# Patient Record
Sex: Male | Born: 1945 | Race: White | Hispanic: No | Smoking: Former smoker
Health system: Southern US, Community
[De-identification: ages and names within clinical notes are randomized; demographics above are authoritative.]

## PROBLEM LIST (undated history)

## (undated) DIAGNOSIS — I10 Essential (primary) hypertension: Secondary | ICD-10-CM

## (undated) DIAGNOSIS — I2510 Atherosclerotic heart disease of native coronary artery without angina pectoris: Secondary | ICD-10-CM

## (undated) DIAGNOSIS — M109 Gout, unspecified: Secondary | ICD-10-CM

## (undated) DIAGNOSIS — K219 Gastro-esophageal reflux disease without esophagitis: Secondary | ICD-10-CM

## (undated) DIAGNOSIS — G8929 Other chronic pain: Secondary | ICD-10-CM

## (undated) DIAGNOSIS — J439 Emphysema, unspecified: Secondary | ICD-10-CM

## (undated) DIAGNOSIS — J9600 Acute respiratory failure, unspecified whether with hypoxia or hypercapnia: Secondary | ICD-10-CM

## (undated) DIAGNOSIS — I259 Chronic ischemic heart disease, unspecified: Secondary | ICD-10-CM

## (undated) DIAGNOSIS — J189 Pneumonia, unspecified organism: Secondary | ICD-10-CM

## (undated) DIAGNOSIS — R0609 Other forms of dyspnea: Secondary | ICD-10-CM

## (undated) DIAGNOSIS — M545 Low back pain, unspecified: Secondary | ICD-10-CM

## (undated) DIAGNOSIS — R0600 Dyspnea, unspecified: Secondary | ICD-10-CM

## (undated) DIAGNOSIS — E7800 Pure hypercholesterolemia, unspecified: Secondary | ICD-10-CM

## (undated) DIAGNOSIS — E785 Hyperlipidemia, unspecified: Secondary | ICD-10-CM

## (undated) HISTORY — PX: CORONARY ANGIOPLASTY WITH STENT PLACEMENT: SHX49

---

## 1998-02-07 ENCOUNTER — Other Ambulatory Visit: Admission: RE | Admit: 1998-02-07 | Discharge: 1998-02-07 | Payer: Self-pay | Admitting: Family Medicine

## 1998-11-16 ENCOUNTER — Other Ambulatory Visit: Admission: RE | Admit: 1998-11-16 | Discharge: 1998-11-16 | Payer: Self-pay | Admitting: Gastroenterology

## 1999-06-23 ENCOUNTER — Encounter: Payer: Self-pay | Admitting: Emergency Medicine

## 1999-06-23 ENCOUNTER — Inpatient Hospital Stay (HOSPITAL_COMMUNITY): Admission: EM | Admit: 1999-06-23 | Discharge: 1999-06-23 | Payer: Self-pay | Admitting: Emergency Medicine

## 1999-07-26 ENCOUNTER — Inpatient Hospital Stay (HOSPITAL_COMMUNITY): Admission: EM | Admit: 1999-07-26 | Discharge: 1999-07-29 | Payer: Self-pay | Admitting: Gastroenterology

## 1999-07-26 ENCOUNTER — Encounter: Payer: Self-pay | Admitting: Gastroenterology

## 1999-07-27 ENCOUNTER — Encounter: Payer: Self-pay | Admitting: Gastroenterology

## 1999-08-31 ENCOUNTER — Encounter (INDEPENDENT_AMBULATORY_CARE_PROVIDER_SITE_OTHER): Payer: Self-pay | Admitting: Specialist

## 1999-08-31 ENCOUNTER — Observation Stay (HOSPITAL_COMMUNITY): Admission: RE | Admit: 1999-08-31 | Discharge: 1999-09-01 | Payer: Self-pay | Admitting: Surgery

## 2000-05-17 ENCOUNTER — Ambulatory Visit (HOSPITAL_COMMUNITY): Admission: RE | Admit: 2000-05-17 | Discharge: 2000-05-17 | Payer: Self-pay | Admitting: Internal Medicine

## 2000-05-17 ENCOUNTER — Encounter: Payer: Self-pay | Admitting: Internal Medicine

## 2001-02-28 ENCOUNTER — Encounter (INDEPENDENT_AMBULATORY_CARE_PROVIDER_SITE_OTHER): Payer: Self-pay | Admitting: Specialist

## 2001-02-28 ENCOUNTER — Other Ambulatory Visit: Admission: RE | Admit: 2001-02-28 | Discharge: 2001-02-28 | Payer: Self-pay | Admitting: Gastroenterology

## 2004-11-25 ENCOUNTER — Inpatient Hospital Stay (HOSPITAL_COMMUNITY): Admission: EM | Admit: 2004-11-25 | Discharge: 2004-12-01 | Payer: Self-pay | Admitting: Emergency Medicine

## 2004-11-25 ENCOUNTER — Ambulatory Visit: Payer: Self-pay | Admitting: Internal Medicine

## 2004-11-28 ENCOUNTER — Ambulatory Visit: Payer: Self-pay | Admitting: Pulmonary Disease

## 2006-10-21 ENCOUNTER — Ambulatory Visit: Payer: Self-pay | Admitting: Cardiology

## 2006-10-21 ENCOUNTER — Inpatient Hospital Stay (HOSPITAL_COMMUNITY): Admission: EM | Admit: 2006-10-21 | Discharge: 2006-10-23 | Payer: Self-pay | Admitting: Cardiology

## 2006-10-22 ENCOUNTER — Encounter: Payer: Self-pay | Admitting: Cardiology

## 2006-11-13 ENCOUNTER — Ambulatory Visit: Payer: Self-pay | Admitting: Cardiovascular Disease

## 2007-01-28 ENCOUNTER — Ambulatory Visit: Payer: Self-pay | Admitting: Cardiology

## 2007-02-03 ENCOUNTER — Ambulatory Visit: Payer: Self-pay | Admitting: Cardiovascular Disease

## 2007-04-16 ENCOUNTER — Ambulatory Visit: Payer: Self-pay | Admitting: Cardiology

## 2007-07-21 ENCOUNTER — Ambulatory Visit: Payer: Self-pay | Admitting: Cardiovascular Disease

## 2007-07-21 ENCOUNTER — Ambulatory Visit: Payer: Self-pay | Admitting: Cardiology

## 2007-08-14 ENCOUNTER — Ambulatory Visit: Payer: Self-pay | Admitting: Cardiovascular Disease

## 2008-02-02 ENCOUNTER — Ambulatory Visit: Payer: Self-pay | Admitting: Cardiovascular Disease

## 2008-02-05 ENCOUNTER — Ambulatory Visit: Payer: Self-pay

## 2009-03-31 ENCOUNTER — Encounter (INDEPENDENT_AMBULATORY_CARE_PROVIDER_SITE_OTHER): Payer: Self-pay | Admitting: *Deleted

## 2010-11-28 NOTE — Assessment & Plan Note (Signed)
[facility]                            CARDIOLOGY OFFICE NOTE   NASIIR, MONTS                     MRN:          [id number]  DATE:02/03/2007                            DOB:          24-Aug-1945    Tony Garcia presents as an outpatient to the [facility] Cardiology  office on February 03, 2007.  He is a 65 year old gentleman who suffered an  anterior myocardial infarction back in April 2008 and was treated with  overlapping bare-metal stents in his LAD for a total occlusion.  His  post MI LVEF is well preserved at 55%.  From a symptomatic standpoint,  he has done very well.   His activity level has been fair.  He has been active and walks  regularly but has really not been engaged in any formal exercise.  He  has been trying to watch his diet but says that he likes country  cooking.  He has not had any chest pain, dyspnea, orthopnea, PND,  edema, or palpitations.  He had an episode of lightheadedness last week  upon standing but he was out in 100-degree heat when this occurred.  He  has had no syncope or other complaints.   CURRENT MEDICATIONS:  1. PLATO study drug.  2. Pravachol 40 mg at bedtime.  3. Aspirin 81 mg daily.  4. Nexium 40 mg daily.  5. Coreg 6.25 mg twice daily.  6. Lisinopril 10 mg daily.   ALLERGIES:  PENICILLIN.   PHYSICAL EXAMINATION:  The patient is alert and oriented.  He is no  acute distress.  Weight is 193 pounds, blood pressure is 124/62, heart  rate is 57, respiratory rate is 16.  HEENT:  Normal.  NECK:  Normal carotid upstrokes without bruits, jugular venous pressure  is normal.  LUNGS:  Clear to auscultation bilaterally.  HEART:  Regular rate and rhythm without murmurs or gallops.  ABDOMEN:  Soft, obese, nontender, no organomegaly.  EXTREMITIES:  No clubbing, cyanosis, or edema.  Peripheral 2+ and equal  throughout.   Lab work reviewed from April showed a total cholesterol of 213 with an  HDL of 29 and an LDL  of 146.  Renal function and CBC were within normal  limits.   ASSESSMENT:  Tony Garcia remains stable from a cardiovascular  standpoint.  His cardiac problems are as follows:  1. History of anterior wall myocardial infarction and percutaneous      coronary intervention of the left anterior descending artery.  His      blood pressure and heart rate are under optimal control on medical      therapy with carvedilol and lisinopril.  Did not make any      adjustments today.  He should continue on aspirin and PLATO study      drug.  As above, LVEF is excellent at 55%.  2. Dyslipidemia.  His lipids were drawn at the time of his myocardial      infarction.  He was not on therapy at that time.  He has been      intolerant to multiple statins and he is  now tolerating Pravachol      well.  He is due for lipids but he has already eaten this morning      and we asked him to return to the clinic for a lipid panel and      liver function tests.  Also encouraged increased exercise with an      aim towards weight loss.  Tony Garcia has lost 5 pounds since his      last office visit and he is down from a 38 pants to a 36-inch      pants.  3. Followup.  I would like to see Tony Garcia back in 6 months.  If      he has any problems in the interim, I would be happy to see him      back sooner.     [name], MD  Electronically Signed    MDC/MedQ  DD: 02/03/2007  DT: 02/03/2007  Job #: [id number]   cc:   Gaspar Garbe, M.D.

## 2010-11-28 NOTE — Assessment & Plan Note (Signed)
[facility]                            CARDIOLOGY OFFICE NOTE   CARNIE, BRUEMMER                     MRN:          [id number]  DATE:07/21/2007                            DOB:          [birth year]    Tony Garcia was seen in follow-up at the [facility] Cardiology office  on July 21, 2007.  Tony Garcia is a 65 year old gentleman who had an  anterior wall MI in April 2008.  He was treated with overlapping bare  metal stents in the LAD.  He had excellent recovery of his LV function  with an ejection fraction of 55%.   Tony Garcia has minimal cardiac symptoms.  He has noted mild exertional  dyspnea which he relates to deconditioning.  He has had no chest pain or  pressure.  He denies lightheadedness, palpitations or near syncope.  He  has had no lower extremity edema.   The main problem has been related to statin intolerance.  HE HAS BEEN  INTOLERANT TO MULTIPLE STATINS IN THE PAST.  Following his MI he was  ultimately started on Tricor as well in order to better treat his low  HDL cholesterol and hypertriglyceridemia.  He developed a good deal of  difficulty with bilateral knee pain as well as knee swelling.  He  stopped taking the Tricor back in the fall and stopped Pravachol in late  November.  His symptoms improved, but he does have residual knee pain.  He gave himself another trial of Pravachol at the end of December and  took it for only 3 days before developing recurrent knee swelling.  He  has been off of all lipid lowering therapy with the exception of fish  oil and his symptoms are slowly improving.  Despite that, he continues  to have some leg pain with walking and involves the knees as well as the  calves.   CURRENT MEDICATIONS:  1. Aspirin 81 mg daily.  2. PLATO study drug, which is completed today.  3. Nexium 40 mg daily.  4. Coreg 6.25 mg twice daily.  5. Lisinopril 10 mg daily.   ALLERGIES:  PENICILLIN.   PHYSICAL  EXAMINATION:  The patient is alert and oriented.  He is in no  acute distress.  His weight is 201, blood pressure 126/82, heart rate  52, respiratory rate 16.  HEENT is normal.  NECK:  Normal carotid upstrokes without bruits.  Jugular venous pressure  is normal.  There is no thyromegaly or thyroid nodules.  LUNGS:  Clear to auscultation bilaterally.  Heart regular rate and rhythm without murmurs or gallops.  ABDOMEN:  Soft, obese, nontender no organomegaly.  Bowel sounds are  present.  EXTREMITIES:  There is no clubbing, cyanosis or edema.  Pedal pulses are  2+ and equal.  Skin is warm and dry without rash.   ASSESSMENT:  1. Coronary artery disease status post anterior myocardial infarction.      Will continue current medical therapy which includes Coreg,      lisinopril and aspirin.  [name] was previously and the PLATO      study  which is a randomized trial comparing Plavix to another      antiplatelet drug.  Now that he has completed the study, I would      like to change him over to open label Plavix to take him out to 1      year out from his infarct.  This would continue through April of      this year.  At that point, he can discontinue Plavix and remain on      aspirin alone.  His blood pressure is in the ideal range and he      does not require upward titration of his Coreg or lisinopril in the      setting of normalized left ventricular function.  2. Dyslipidemia.  Lipids from August of last year showed total      cholesterol of 203 with triglycerides of 249, HDL of 29 and an LDL      of 124.  At that point he was started on Tricor.  As detailed      above, he is off of all lipid lowering therapy.  In the setting of      his myocardial infarction and known coronary artery disease      certainly needs lipid lowering therapy.  He has given a good trial      to multiple medications and I am comfortable saying that he is      COMPLETELY INTOLERANT TO ALL STATINS AT THIS  POINT.  I am going to      give him a trial of Niaspan 500 mg daily for 2 weeks and to      increase to 1 gram daily.  I have also asked him to resume fish      oil.  I counseled him in detail about flushing and about potential      of flushing and gave him instructions to take his Niaspan 30      minutes after aspirin.  Will followup lipids and LFTs in 2 months      or  if he prefers he could follow in Dr. Deneen Harts office.   Overall, I think Tony Garcia is doing well.  We talked about the  importance of weight loss as he has significant central obesity.  He  will hopefully as his knees improve he will be able to do more exercise.  I would like to see him back in follow-up in 6 months.  I have scheduled  him for an ABI test to rule out vascular disease as  contributing factor  to his leg pain.     [name], MD  Electronically Signed    MDC/MedQ  DD: 07/21/2007  DT: 07/21/2007  Job #: [id number]   cc:   Gaspar Garbe, M.D.

## 2010-11-28 NOTE — Assessment & Plan Note (Signed)
[facility]                            CARDIOLOGY OFFICE NOTE   Tony Garcia                     MRN:          [id number]  DATE:02/02/2008                            DOB:          [birth year]    Tony Garcia was seen in followup with [facility] office on  February 02, 2008.  He is a 65 year old gentleman with coronary artery  disease who initially presented with an anterior wall MI in April 2008.  He was treated acutely with 2 bare-metal stents in the LAD and had  excellent recovery of his LV function with an post MI, LVEF of 55%.   Tony Garcia complains of exertional dyspnea.  He attributes this to  weight gain, but he is not sure whether this is the cause.  He denies  chest pain.  He also complains of generalized fatigue.  He denies  palpitations, lightheadedness, syncope, or edema.  His main problem at  present is bilateral knee pain.  He has been treated with ibuprofen and  he is going to undergo x-rays in the near future. His activity level is  limited by his knees.  His knee pain was worse with the trial of  multiple statins and even with TriCor.  He is off all lipid-lowering  medications because of exacerbation of his pain problems.   MEDICATIONS:  1. Aspirin 81 mg daily.  2. Nexium 40 mg daily.  3. Coreg 6.25 mg b.i.d.  4. Lisinopril 10 mg daily.  5. Fish oil 1200 mg 2 daily.  6. Vitamin D 1000 mg daily.  7. Multivitamin daily.   ALLERGIES:  PENICILLIN.   PHYSICAL EXAMINATION:  GENERAL:  The patient is alert and oriented.  He  is in no acute distress.  VITAL SIGNS:  Weight is 210 pounds, blood pressure 154/80, heart rate  55, and respiratory rate 16.  HEENT:  Normal.  NECK:  Normal carotid upstrokes with soft bilateral bruits.  Jugular  venous pressure is normal.  LUNGS:  Clear bilaterally.  HEART:  Bradycardic and regular with a grade 1/6 systolic ejection  murmur at the base.  No gallops.  No diastolic murmurs.  ABDOMEN:  Soft, obese, and nontender.  No organomegaly.  EXTREMITIES:  No clubbing, cyanosis, or edema.  Peripheral pulses are  intact and equal.   EKG shows sinus bradycardia with an age-indeterminate septal infarct.   ASSESSMENT:  1. Coronary artery disease, status post anterior myocardia infarction.      Continue medical therapy with aspirin, Coreg, and lisinopril.  His      blood pressure is elevated and he will benefit from more aggressive      antihypertensive treatment.  I doubled his lisinopril to 20 mg.  We      will see how he initially responses to this.  Another consideration      would be to increase his Coreg and this can be done at a later      date.  It may be limited in Coreg increased by his resting      bradycardia.  He clearly would benefit from weight loss,  which      would vastly improve his blood pressure control, lipid control, and      even his knee pain.  We had an at length discussion regarding this      matter today.  2. Dyslipidemia, intolerant to LIPID-LOWERING MEDICATIONS.  Continue      fish oil.  3. Exertional dyspnea.  In the setting of known coronary artery      disease and previous myocardial infarction, we will check an      adenosine Myoview to rule out recurrent ischemia.  Of note, the      patient has 2 bare metal stents in his left anterior descending.  I      am hopeful this is not due to an intrinsic cardiac problem and may      be related to his obesity.   For followup, I will see Tony Garcia back in 6 months.     Tony Garcia. Tony Seltzer, MD  Electronically Signed    MDC/MedQ  DD: 02/02/2008  DT: 02/03/2008  Job #: [id number]   cc:   Burnell Blanks, MD

## 2010-12-01 NOTE — H&P (Signed)
NAME:  Tony Garcia, Tony Garcia              ACCOUNT NO.:  [id number]   MEDICAL RECORD NO.:  [id number]          PATIENT TYPE:  EMS   LOCATION:  [facility]                         FACILITY:  [facility]   PHYSICIAN:  Larina Earthly, M.D.        DATE OF BIRTH:  Dec 01, 1945   DATE OF ADMISSION:  11/25/2004  DATE OF DISCHARGE:                                HISTORY & PHYSICAL   CHIEF COMPLAINT:  Fevers, chills, myalgias refractory to Tylenol, and  nonsteroidal antiinflammatory agents while on amoxicillin for the last five  days.   HISTORY OF PRESENT ILLNESS:  This is a 65 year old Caucasian male who has a  past medical history significant for gastroesophageal reflux disease  associated with Barrett esophagus and hypertension, who started experiencing  fevers and chills along with myalgias last Tuesday, approximately five days  ago, associated with some transient chest tightness.  He actually left work  and has not been at work since secondary to the same.  He was started on  amoxicillin empirically, on Nov 22, 2004, but despite using Tylenol  alternating with over the counter ibuprofen, the patient continued to  experience temperatures ranging from 99 degrees to 104 degrees Fahrenheit  associated with again fever, rigors, and diffuse myalgias.  The patient  subsequently presented to the emergency room for further evaluation today  and was found to have a right upper lobe pneumonia.   REVIEW OF SYSTEMS:  Negative for nausea, vomiting but positive for diffuse  headaches along with the myalgias.  Negative for chest pain.  Positive for  myalgias.  Negative for change in bowel habits or new neurological deficits.  Interestingly, the patient states that he has had minimal cough or pulmonary  symptoms.   SOCIAL HISTORY:  The patient is married for numerous years.  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  Denies any tobacco abuse  but did smoke remotely.  States that he consumes 3-4 beers per day  but has  not consumed any in approximately a week.  The patient denies any history of  aspiration pneumonia.   CURRENT MEDICATIONS:  1.  Norvasc 5 mg p.o. every day.  2.  Nexium 40 mg p.o. every day.   PROBLEM LIST:  1.  Hypertension.  2.  Gastroesophageal reflux disease with Barrett esophagus, followed by Dr.      Russella Dar with endoscopy planned every two years with the last endoscopy      being in 2005.  3.  Hyperlipidemia with a history of Lipitor causing myalgias.  4.  History of cholecystectomy in 1999.  5.  History of remote appendectomy.   FAMILY HISTORY:  Significant for pneumonia in his mother and his father who  died at the age of 31 of coronary artery disease and strokes.  There is no  malignancy in the family.   PHYSICAL EXAMINATION:  GENERAL:  We have a well nourished Caucasian male  lying in bed in no apparent distress, answering questions in full sentences.  Alert and oriented x 3.  VITAL SIGNS:  Temperature 103.3 degrees Fahrenheit, decreased to 102.6  degrees during  ER stay.  Blood pressure 126/72, pulse 75 and regular,  respirations 20 non-labored.  Oxygen saturation 95% on room air.  HEENT:  Sclerae anicteric.  Extraocular movements are intact.  There are no  oropharyngeal lesions.  NECK:  Supple.  There is no cervical or axillary lymphadenopathy.  Face is  symmetric.  LUNGS:  Clear to auscultation bilaterally.  CARDIOVASCULAR:  Reveals a regular rate and rhythm.  ABDOMEN:  Reveals a soft, nontender, nondistended abdomen.  Bowel sounds are  present.  EXTREMITIES:  Reveals no edema.  Pedal pulses are intact.  There is no  active synovitis.  NEUROLOGIC:  Grossly nonfocal.   ASSESSMENT/PLAN:  1.  Pneumonia, presumably community-acquired complicated by moderate alcohol      use.  We will continue Rocephin and azithromycin initiated during      emergency room stay and follow up on blood cultures x 2.  Oxygen      saturation is currently within normal limits on  room air and clinically      the patient's appearance is not severe with the exception of his fevers,      chills, and myalgias.  Again the patient has a minimal cough and this is      certainly concerning, again possible etiologies include aspiration with      his alcohol use, although he denies any significant intake and/or      altered mental status using the same.  Certainly underlying malignancy      is of issue, however, the patient states that his minimal tobacco abuse      was remote.  We will certainly need to follow up on chest x-ray to      ensure clearance of his right upper lobe pneumonia.  2.  Barrett esophagus with gastroesophageal reflux disease.  We will      continue proton pump inhibitor and Mylanta p.r.n.  The patient's last      EGD was last year per family report.  3.  Hypertension.  We will continue calcium channel blocker.  4.  The patient is fairly ambulatory, however, given significant rigors and      chills, we will start deep vein thrombosis prophylaxis with subcutaneous      Lovenox.      RA/MEDQ  D:  11/25/2004  T:  11/26/2004  Job:  [id number]   cc:   Gaspar Garbe, M.D.  [address]  [place]  [place] [address]  Fax: [phone]   Venita Lick. Russella Dar, M.D. [facility]

## 2010-12-01 NOTE — Discharge Summary (Signed)
NAMEKNOLAN, SIMIEN              ACCOUNT NO.:  [id number]   MEDICAL RECORD NO.:  [id number]          PATIENT TYPE:  INP   LOCATION:  2017                         FACILITY:  [facility]   PHYSICIAN:  [name], MD  DATE OF BIRTH:  05-Nov-1945   DATE OF ADMISSION:  10/21/2006  DATE OF DISCHARGE:  10/23/2006                               DISCHARGE SUMMARY   PRIMARY CARDIOLOGIST:  Tonny Bollman, M.D.   PRIMARY CARE PHYSICIAN:  Guerry Bruin, M.D.   PRINCIPAL DIAGNOSIS:  Acute anterior ST-segment elevation myocardial  infarction.   SECONDARY DIAGNOSES:  1. Coronary artery disease.  2. Hypertension.  3. Hyperlipidemia.  4. Remote tobacco abuse.  5. Gastroesophageal reflux disease.  6. Barrett's esophagus status post cholecystectomy approximately 7-8      years ago.  7. EtOH abuse.  8. Obesity.   ALLERGIES:  LIPITOR AND ZOCOR INTOLERANCE with significant myalgias.   PROCEDURES:  Left heart cardiac catheterization with successful PCI and  stenting of the occluded left anterior descending with placement of two  Multi-link Vision bare-metal stents.  A 2D echocardiogram revealing an  EF of 55% with periapical hypokinesis.   HISTORY OF PRESENT ILLNESS:  A 65 year old married Caucasian male with  no prior history of CAD.  He was in his usual state of health to  approximately 2-3 weeks prior to admission when he began to experience  intermittent resting and exertional substernal chest pressure lasting  just a few minutes and resolving with belching.  On the day of  admission, October 21, 2006, he was at home and developed 10/10 substernal  chest pressure with shortness of breath at approximately 2:30 p.m.  When  symptoms did not resolve, he activated EMS and initial ECG showed  anterior ST-segment elevation.  He was treated with sublingual  nitroglycerin as well as aspirin and morphine with some reduction in  chest pain to 3/10.  He was taken emergently to the [facility] Cardiac  Cath Lab.   HOSPITAL COURSE:  Emergent cardiac catheterization was performed  revealing a totally occluded proximal LAD and otherwise nonobstructive  coronary artery disease.  He underwent successful PCI and stenting of  the proximal LAD with placement of a 3.0 x 28-mm Multi-link Vision bare-  metal stent as well as a 3.0 x 8-mm Multi-link Vision bare-metal stent.  He tolerated this procedure well and post procedure was monitored in the  coronary intensive care unit, where he was placed on beta blocker, ACE  inhibitor, aspirin, and Statin therapy.  Prior to catheterization, the  patient was involved in the PLATO study and was maintained on the PLATO  study drug regimen throughout his hospitalization with good tolerance.  A 2D echocardiogram was performed on April 8, revealing an EF of 55%  with periapical hypokinesis.  He has been placed on Pravachol therapy  secondary to LIPITOR and ZOCOR intolerance, which up to this point he  has tolerated.  His medications have been titrated and consolidated, and  he is being discharged home today in good condition.  He has been  counseled on the importance of alcohol cessation.  DISCHARGE LABS:  Hemoglobin 13.2, hematocrit 38.7, WBC 9.8, platelets  325.  MCV 89.0.  Sodium 140, potassium 3.8, chloride 100, CO2 of 30, BUN  11, creatinine 0.99, glucose 100.  PT 13.6, INR 1.0, PTT 28.  Total  bilirubin 0.8, alkaline phosphatase 97.  AST 251, ALT 102, albumin 3.1.  CK 1806, peak MB 149.6, peak troponin-I 97.44.  Total cholesterol 213,  triglycerides 198, HDL 29 and LDL 146.  Calcium of 8.9, magnesium 2.5,  TSH 4.332.   DISPOSITION:  The patient is being discharged home today in good  condition.   FOLLOWUP PLANS AND APPOINTMENTS:  The patient will follow up with Dr.  Tonny Bollman on April 30 at 3:15 p.m.  He is asked to follow up with  his primary care physician, Dr. Wylene Simmer, as previously scheduled.   DISCHARGE MEDICATIONS:  1. Coreg 6.25 mg  b.i.d.  2. Lisinopril 10 mg daily.  3. Aspirin 325 mg daily.  4. PLATO study drug b.i.d.  5. Pravachol 40 mg q.p.m.  6. Nexium 40 mg daily.  7. Nitroglycerin 0.4 mg sublingual p.r.n. chest pain.   OUTSTANDING LABORATORY STUDIES:  None.   DURATION OF DISCHARGE ENCOUNTER:  40 minutes including physician time.      Nicolasa Ducking, ANP      [name], MD  Electronically Signed    CB/MEDQ  D:  10/23/2006  T:  10/23/2006  Job:  [id number]   cc:   Gaspar Garbe, M.D.

## 2010-12-01 NOTE — Discharge Summary (Signed)
Tony Garcia, Tony Garcia              ACCOUNT NO.:  [id number]   MEDICAL RECORD NO.:  [id number]          PATIENT TYPE:  INP   LOCATION:  5507                         FACILITY:  [facility]   PHYSICIAN:  Gaspar Garbe, M.D.DATE OF BIRTH:  15-Jul-1946   DATE OF ADMISSION:  11/25/2004  DATE OF DISCHARGE:  12/01/2004                                 DISCHARGE SUMMARY   DIAGNOSES:  1.  Right upper lobe pneumonia, community-acquired.  2.  Elevated liver function tests, presumed secondary to alcohol intake.  3.  Hypertension.  4.  History of Barrett's esophagus and reflux.   DISCHARGE MEDICATIONS:  1.  Avelox 400 mg one p.o. daily to be completed on Dec 09, 2004.  2.  Norvasc 5 mg once daily.  3.  Nexium 40 mg once daily.   LABORATORY DATA:  On date of discharge white count 11.2, hemoglobin 12.9,  hematocrit 39.0, platelet count 557,000.  Sodium 141, potassium 3.9,  alkaline phosphatase slightly elevated at 189, AST slightly elevated at 62.  ALT elevated at 107, total protein 6.8, albumin 2.3.  Hepatitis panel shows  hepatitis A IgG antibody positive indicating prior exposure.  HIV test  negative.  Hepatitis C negative, hepatitis B negative.  Strep pneumonia  screen by urine antigen negative.  Pneumocystis carinii pneumonia negative  for Legionella.  EIA negative.  AFB's X3 smear negative.  Cultures currently  pending.  PPD placed on right arm read as negative on Nov 30, 2004.   PHYSICAL EXAMINATION:  VITAL SIGNS:  On date of discharge vital signs with  temperature 98.2, pulse 58, respiratory rate 20, blood pressure 127/67.  GENERAL APPEARANCE:  No acute distress.  HEENT:  Normocephalic, atraumatic.  Pupils equal, round, reactive to light  and accommodation. Extraocular movements intact. ENT within normal limits.  NECK:  Supple, no lymphadenopathy, jugular venous distention or bruit.  LUNGS:  Patient has coarse right sided sounds consistent with right upper  lobe pneumonia.  HEART:   Regular rate and rhythm.  ABDOMEN:  Soft, non-tender, normoactive bowel sounds.  EXTREMITIES:  No cyanosis, clubbing or edema.  NEUROLOGICAL:  Oriented X3.  Cranial nerves II-XII are intact.  Patient has  5/5 strength bilaterally.   HOSPITAL COURSE:  Patient was admitted after not wanting to come into the  office and having antibiotics called in, he continued with fevers, and was  found to have a right upper lobe pneumonia and was given Rocephin and  azithromycin and watched over the weekend, however on me seeing him on  Monday he continued to have fevers and ID and pulmonary consultations were  undertaken shortly thereafter.  He was put on respiratory precautions,  isolation, and ruled out for tuberculosis with the above laboratory tests.  We continued him on Rocephin and azithromycin until Nov 30, 2004 at which  time he was changed to Avelox by mouth.  The patient's fevers defervesced  buy Nov 29, 2004 and he remained with very low grade fever only sporadically  prior to the time of his discharge.  He underwent a CT scan which did not  show any gross obstruction per  Dr. Lynelle Doctor reading and did not require  bronchoscopy.  Bronchoscopy is being deferred as he has shown clinical  resolution.  He is to have a follow up chest x-ray in three weeks, which if  not fully cleared may require additional work up and bronchoscopy.  The  patient is clear on the above diagnosis and I indicated to him that he  needed to take it easy over the next week until his pneumonia completely  cleared and he should not return to work until Dec 10, 2004 at the earliest.  A note was given to this effect as was a prescription for his Avelox.  We  also discussed that the calcifications on his CT scan were most likely  related to old granulomatous disease and were not consistent with lymph  nodes or cancer nor is there any sign of any tumor or mass causing the  obstruction and the pneumonia.   CONDITION ON  DISCHARGE:  Patient is discharged in good condition on the  morning of Dec 01, 2004.   FOLLOW UP:  Follow up is to be arranged per Dr. Deneen Harts office.  We will  contact him on Monday to arrange follow up in approximately three weeks for  repeat chest x-ray with the above-noted need for bronchoscopy if he does not  completely clear.  Patient has been informed that if his fevers worsen over  the course of the weekend he is to call the office to discuss with the on-  call physician.      RWT/MEDQ  D:  12/01/2004  T:  12/01/2004  Job:  [id number]   cc:   Cliffton Asters, M.D.  [address] [place]  [place] [address]  Fax: [phone]   Shan Levans, M.D. [facility]

## 2010-12-01 NOTE — Assessment & Plan Note (Signed)
[facility]                            CARDIOLOGY OFFICE NOTE   Tony Garcia, Tony Garcia                     MRN:          [id number]  DATE:11/13/2006                            DOB:          Oct 15, 1945    Tony Garcia returns for hospital followup as an outpatient to the  [facility] office on November 13, 2006.  He is a very nice 65-year-  old gentleman who presented to the hospital on April 7th with an acute  anterior MI.  He was brought emergently to the cardiac catheterization  lab and was found to have an occluded LAD.  He was treated with  overlapping bare metal stents with a 3 x 28 mm Vision stent and a second  3 x 8 mm Vision stent just distal to that in an overlapping fashion.  He  had an excellent recovery and was discharged from the hospital on April  9th.  His post MI echocardiogram demonstrated a left ventricular  ejection fraction estimated at 55%.  Since return home he has done  relatively well.  He has been active with daily walking as well as  regular gardening.  He has complained of some joint aches over the last  few days, but this is not uncommon for him as he has significant  arthritis.  He has no chest pain, dyspnea or other complaints.   CURRENT MEDICINES:  1. Clopidogrel 75 mg daily.  2. PLATO study drug daily.  3. Pravachol 40 mg daily.  4. Aspirin 81 mg daily.  5. Nexium 40 mg daily.  6. Lisinopril 10 mg daily.  7. Coreg 6.25 mg twice daily.  8. Fish Oil daily.   ALLERGIES:  NKDA.   PERTINENT PAST MEDICAL HISTORY:  1. A history of hypertension.  2. Remote tobacco abuse.  3. Barrett's esophagus.  4. Longstanding alcohol use.   PHYSICAL EXAMINATION:  He is alert and oriented, in no acute distress.  His weight is 198 pounds, blood pressure is 136/73, heart rate 60,  respiratory rate 16.  HEENT:  Normal.  NECK:  Normal carotid upstrokes with a soft left carotid bruit.  LUNGS:  Clear to auscultation  bilaterally.  HEART:  Regular rate and rhythm with a 2/6 ejection murmur along the  left sternal border.  ABDOMEN:  Soft, nontender, obese, no organomegaly.  EXTREMITIES:  No cyanosis, clubbing or edema.  Peripheral pulses are 2+  and equal throughout.   EKG demonstrates normal sinus rhythm with an anteroseptal MI pattern,  age undetermined, as well as a diffuse anterolateral T-wave abnormality  consistent with his recent myocardial infarction.   Lipid studies from the hospital on April 8th show a cholesterol of 213,  triglycerides of 190, HDL of 29, LDL of 146.   ASSESSMENT:  Tony Garcia is currently stable from a cardiac standpoint  with regard to his recent anterior wall myocardial infarction.  His  current cardiac problems are as follows:  1. Anterior myocardial infarction status post percutaneous coronary      intervention.  He should continue on aspirin and antiplatelet      therapy per  the PLATO study for a minimum of 12 months.  He should      also continue on lisinopril and carvedilol.  I did not make any      dose adjustments today.  I would like to see Tony Garcia back in      approximately 8 weeks and reassess him at that point and likely      will increase his therapy at that time.  2. Dyslipidemia.  I hope that he is not having myalgias from Pravachol      as he has noticed increased aches and pains over the last few days.      He has had problems with both Lipitor and Zocor in the past.  Since      he has some problems with arthritis, I would like him to try to      stick this out for a little while as he certainly has a lot of      benefit to gain from long term Statin use.  I am going to recheck      his lipids and LFTs in 8 weeks and likely add Zetia to his medical      regimen at that point, as I doubt his LDL will be near goal on      monotherapy with Pravachol.  3. Left ventricular dysfunction secondary to his acute myocardial      infarction.  His overall  left ventricular ejection fraction was      preserved at 55%.  He had an apical wall motion abnormality      consistent with his anterior infarction.  He will remain on medical      therapy as described above.   He inquired today about returning to work.  He drives a truck on the  weekends.  I would like him to take 2 more weeks off prior to starting  back to work and I reviewed this today with both Tony Garcia and his  wife.     Tony Fells. Excell Seltzer, MD  Electronically Signed    MDC/MedQ  DD: 11/13/2006  DT: 11/13/2006  Job #: [id number]   cc:   Gaspar Garbe, M.D.

## 2010-12-01 NOTE — H&P (Signed)
Tony Garcia, VONADA              ACCOUNT NO.:  [id number]   MEDICAL RECORD NO.:  [id number]          PATIENT TYPE:  INP   LOCATION:  2889                         FACILITY:  [facility]   PHYSICIAN:  [name], MD  DATE OF BIRTH:  [birth year]   DATE OF ADMISSION:  10/21/2006  DATE OF DISCHARGE:                              HISTORY & PHYSICAL   PRIMARY CARDIOLOGIST:  He is new to [facility]; being seen by  Dr. Tonny Bollman.   PRIMARY CARE PHYSICIAN:  Gaspar Garbe, M.D.   PATIENT PROFILE:  A 65 year old married Caucasian male with no prior  history of CAD who presents with acute anterior ST-segment elevation  myocardial infarction.   PROBLEMS:  1. Acute anterior ST-segment elevation myocardial infarction/chest      pain.  2. Hypertension.  3. Remote tobacco abuse.      a.     90-pack-year history, quitting approximately 10 years ago.  4. ETOH abuse.      a.     He drinks a six-pack per day for the past 35 years.  5. Obesity.  6. GERD/Barrett's esophagus.  7. Status post cholecystectomy approximately 7 to 8 years ago.   HISTORY OF PRESENT ILLNESS:  A 65 year old married Caucasian male with  no prior history of CAD.  He was in his usual state of health until  approximately 2 to 3 weeks ago when he began to experience intermittent  rest and exertional substernal chest pressure lasting a few minutes and  resolving with belching.  This occurred maybe on 2 or 3 occasions over  the past couple of weeks.  Today he was at home and developed a sudden  onset of 10//10 substernal chest pressure with shortness of breath at  approximately 2:30 p.m.  He denies any nausea, vomiting, or diaphoresis.  When the symptoms did not resolve, he activated EMS, and upon their  arrival, ECG showed anterior ST-segment elevations.  He was treated with  sublingual nitroglycerin x2 as well as 324 mg of aspirin and IV morphine  which reduced his pain to 3/10.  He was taken emergently to  the [facility] ED where his ECG was rapidly evaluated and he was transferred  immediately to the [facility] cath lab, arriving at 1617.   ALLERGIES:  NO KNOWN DRUG ALLERGIES.   HOME MEDICATIONS:  Norvasc and Nexium.   FAMILY HISTORY:  His mother is age 66 with a history of hypertension.  Father died with a history of hypertension and pneumonia.  He has 1  sister and 1 brother, both are alive and well.   SOCIAL HISTORY:  He lives in [place] with his wife.  He works as a Ecologist.  He has 2 grown children.  He has a 90-pack-year history of  tobacco abuse, quitting approximately 10 years ago.  He drinks a six-  pack per day and has done so for the past 30 to 35 years.  He denies any  drug use.  He does not routinely exercise.   REVIEW OF SYSTEMS:  Positive chest pain, shortness of breath, otherwise  all systems reviewed and negative.   PHYSICAL EXAM:  VITAL SIGNS:  He is afebrile, his heart rate 68,  respirations 14, blood pressure 159/94, pulse ox 92% on 2 L.  GENERAL:  A Caucasian male in no acute distress, awake, alert, oriented  x3.  NECK:  Normal carotid upstroke, no bruit or JVD.  LUNGS:  Respiration is regular, unlabored, clear to auscultation.  CARDIAC:  Regular S1, S2.  No murmurs.  ABDOMEN:  Round, soft, nontender, nondistended.  Bowel sounds present  x4.  EXTREMITIES:  Warm and dry, pink.  No clubbing, cyanosis, or edema.  Dorsalis pedis and posterior tibial pulses 2+ and equal bilaterally.   CLINICAL FINDINGS:  Chest x-ray is pending.  EKG shows sinus rhythm with  anterior ST-segment elevations.  Lab work is pending.   ASSESSMENT AND PLAN:  1. Anterior ST-segment elevation myocardial infarction, emergent      catheterization.  Add aspirin, Plavix, Lipitor 80, beta blocker,      and ACE inhibitor.  Eventual cardiac rehab and ETOH cessation.  He      has been enrolled in the PLATO study; thus, he will receive the      PLATO study drug in lieu of Plavix.  2.  Hypertension, blood pressure currently elevated, add beta blocker.  3. Lipid status currently unknown.  We will check lipids and liver      function tests and add Statin.  4. Gastroesophageal reflux disease.  Add an H2 blocker secondary to      possible interaction between proton pump inhibitor and Plavix.      Nicolasa Ducking, ANP      [name], MD  Electronically Signed    CB/MEDQ  D:  10/21/2006  T:  10/22/2006  Job:  [id number]

## 2010-12-01 NOTE — H&P (Signed)
[place]  Patient:    Tony Garcia, Tony Garcia                       MRN: [id number] Adm. Date:  07/26/99 Attending:  Judie Petit T. Pleas Koch., M.D. [facility] Dictator:   Antionette Fairy. Gill, P.A.C.                         History and Physical  CHIEF COMPLAINT:  Status post ERCP with sphincterotomy.  HISTORY OF PRESENT ILLNESS:  Mr. Kussman is a relatively healthy 65 year old white male with history significant for gastroesophageal reflux disease and Barretts esophagus.  He is followed by Dr. Claudette Head.  He underwent an ERCP today by  Dr. Russella Dar for evaluation of epigastric pain associated with nausea and vomiting and anorexia over the past two months.  Laboratory studies performed on January 5, 001 showed elevated liver function tests that included a total bili of 5.0, alk phos 229, AST 291, ALT 466.  An abdominal ultrasound was performed on July 25, 1999 that showed several tiny sand-like stones and thickened gallbladder wall, consistent with cholecystitis.  At that time, a surgical consult was set up with Dr. Magnus Ivan for consideration of a cholecystectomy.  Please note that he had experienced five to six episodes of this severe epigastric pain.  One episode precipitated a visit to the emergency room at [facility] on June 23, 1999.  At that time, a CBC, chemistry profile, lipase, troponin, CK, and liver function tests were all apparently unremarkable.  ERCP today revealed a normal-appearing cholangiogram and normal-appearing partial pancreatogram.  A sphincterotomy and balloon pull-through were performed. Mr. Schroth tolerated the procedure without difficulty, and is being admitted or observation.  PAST MEDICAL HISTORY:  GERD, Barretts esophagitis (no dysplasia), sleep apnea, bursitis, status post appendectomy at [age] years of age.  MEDICATIONS: 1. Prevacid 30 mg p.o. q.d. 2. Hyoscyamine 0.125 mg two q.4h. p.r.n. pain. 3. Ibuprofen  p.r.n.  ALLERGIES:  No known drug allergies.  FAMILY HISTORY:  Pertinent for heart disease and Parkinsons in his father. There is also a family history of longevity.  He has a sister that had her gallbladder removed last year.  There is no family history of colon/gastric cancer or other  gallbladder diseases.  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 He is married.  He has two daughters, ages 1 and 81.  He also has two grandchildren. [place]  He apparently quit smoking one and one-half years ago, after smoking three packs per day.  His wife states he drinks a couple of beers a week, and that he was formerly a heavy drinker many years ago.  REVIEW OF SYSTEMS:  HEENT - Pertinent for glasses.  CARDIOVASCULAR - No problems noted.  RESPIRATORY - Sleep apnea, otherwise no problems noted. GASTROINTESTINAL - As above, and in addition, he has not experienced any diarrhea, constipation, melena, hematochezia, hematemesis, or dysphagia.  GENITOURINARY - No problems noted.  MUSCULOSKELETAL - Pertinent for right knee swelling and bursitis.  LABORATORY:  Please note these labs were performed on July 21, 1999.  White count 5.9, hemoglobin 15.1, hematocrit 47.2, platelet count 407.  Sodium 136, potassium 4.0, chloride 102, CO2 28, glucose 93, BUN 13, creatinine 1.1, calcium 9.3. Total bili 5.0, alk phos 229, SGOT 291, SGPT 466, total protein 7.7, albumin 4.3, and  amylase 84.  PHYSICAL EXAMINATION:  GENERAL:  Mr. Buysse is arousable, but he is sleepy secondary to his anesthesia. He drifts in and out of sleep, but he is in no acute distress.  His wife is at is bedside.  VITAL SIGNS:  Blood pressure 130/88, temperature 97.3, pulse 72, respirations 16, weight 184 pounds.  HEENT:  Atraumatic, normocephalic.  His pupils are equal and round.  He has icteric sclerae.  Pink conjunctivae.  There is no nasal discharge,  although a nasal cannula is in place.  His mucous membranes are moist.  NECK:  Supple.  HEART:  S1, S2, with regular rate and rhythm.  No murmurs, gallops, or rubs were appreciated.  LUNGS:  Poor inspiratory effort secondary to sleepiness.  Questionable decreased breath sounds in the right lower lobe, otherwise clear.  ABDOMEN:  Bowel sounds were present.  Abdomen was soft, minimally distended. Nontender.  No hepatosplenomegaly appreciated.  RECTAL:  Deferred.  EXTREMITIES:  Without edema.  Pedal pulses 2+.  MUSCULOSKELETAL:  Without lower joint extremity tenderness.  IMPRESSION: 62. A 65 year old white male, status post ERCP with sphincterotomy.  Evaluation as    without complications.  Performed today by Dr. Russella Dar.  There were no biliary    abnormalities noted, but small common bile stones versus ampullary sphincter    dysfunction implied by symptoms and liver function tests. 2. History of GERD and Barretts esophagus. 3. History of sleep apnea. 4. History of bursitis.  PLAN: 1. Will admit for observation. 2. Liver function tests and CBC in the a.m. 3. Analgesics p.r.n. 4. Continue Prevacid. 5. If no complications, likely discharge in the a.m. and have patient keep    appointment with Dr. Magnus Ivan as an outpatient. 6. Further orders - see the chart.DD:  07/26/99 TD:  07/26/99 Job: [id number]

## 2010-12-01 NOTE — H&P (Signed)
[place]  Patient:    Tony Garcia, Tony Garcia                       MRN: [id number] Adm. Date:  07/26/99 Attending:  Judie Petit T. Pleas Koch., M.D. [facility] Dictator:   Antionette Fairy. Gill, P.A.C.                         History and Physical  CHIEF COMPLAINT:  Status post ERCP with sphincterotomy.  HISTORY OF PRESENT ILLNESS:  Tony Garcia is a relatively healthy 65 year old white male with history significant for gastroesophageal reflux disease and Barretts esophagus.  He is followed by Dr. Judie Petit T. Pleas Koch.  He underwent an ERCP today by Dr. Russella Dar for evaluation, after experiencing several episodes of severe epigastric pain (five to six), associated with nausea and anorexia over the past two months.  Lab studies performed on July 21, 1999 DD:  07/26/99 TD:  07/26/99 Job: [id number]

## 2010-12-01 NOTE — Cardiovascular Report (Signed)
Tony Garcia              ACCOUNT NO.:  [id number]   MEDICAL RECORD NO.:  [id number]          PATIENT TYPE:  INP   LOCATION:  2807                         FACILITY:  [facility]   PHYSICIAN:  [name], MD  DATE OF BIRTH:  09/24/1945   DATE OF PROCEDURE:  10/21/2006  DATE OF DISCHARGE:                            CARDIAC CATHETERIZATION   PROCEDURE:  Left heart catheterization, selective coronary angiography,  PTCA and stenting of the LAD and Star close of the right femoral artery.   INDICATION:  Tony Garcia is a 65 year old man who has a history of  hypertension and tobacco abuse who presented with an acute anterior wall  myocardial infarction.  He was brought in directly to the cath lab by  EMS in the setting of typical symptoms and a classic ECG.  The patient  had ongoing chest pain on presentation and was consented emergently for  his catheterization.   Risks and indications of the procedure were explained to the patient.  He was enrolled in the PLATO study.  Informed consent was obtained.  The  right groin was prepped, draped and anesthetized with 1% lidocaine.  Using the modified Seldinger technique, a 6-French sheath was placed in  the right femoral artery and initial images of the right coronary artery  were performed with a 6-French diagnostic JR-4 catheter.  Following  diagnostic angiography, a 6-French XB-LAD guide catheter was inserted,  and pictures of the left coronary artery were taken.  The LAD was  occluded, and I elected to perform intervention.  Angiomax was used for  anticoagulation.  The patient was given study drug via the PLATO study  which randomizes him to either 600 mg of clopidogrel or PLATO study  drug.  I initially attempted to wire the lesion with a cougar guidewire  but was unable to pass that guidewire into the LAD.  I then attempted a  whisper wire and ultimately was able to pass the whisper wire into the  LAD but could not cross the  lesion even with the use of a balloon to  direct the wire directly into the lesion.  At that point, after some  time, I elected to remove the wire and balloon and try a 300 cm PT2  moderate support wire with backup via a 2.0 x 20 mm over the wire  balloon.  I eventually was able to cross the lesion with that wire and  pass into the distal LAD.  The lesion was then dilated with a 2-0 x 20-  mm Maverick balloon, and that balloon was dilated up to 6 and then 10  atmospheres.  Following balloon dilatation, there was TIMI II flow in  the vessel.  There was marked residual stenosis.  I elected to stent the  lesion with a 3-0 x 28 Vision stent which was deployed at 14  atmospheres.  Following stent deployment, there was excellent stent  expansion with TIMI III flow in the vessel.  There was a large diagonal  branch that was subtotally occluded.  I elected to post dilate the  stent, and that was performed with  a 3.25 x 13 mm power sail balloon on  multiple inflations up to 16 atmospheres.  Following postdilatation, the  diagonal branch had opened, and the stent throughout the LAD was well  expanded.  However, just beyond the stented segment, there was a  residual 50% stenosis, and I elected to treat that with a 3.0 x 8 mm  Multilink Vision stent which was deployed at 12 atmospheres.  The  overlap stented segment was then postdilated again with the same 3.25  PowerSail balloon at 16 atmospheres.   At the conclusion of the procedure, there was TIMI III flow throughout  the vessel.  The overlapping stents in the LAD were well expanded, and  the diagonal branch was patent with TIMI III flow.  At that point, a  pigtail catheter was inserted into the left ventricle and pressures were  recorded.  I had initially planned on performing a left ventriculogram,  but LV filling pressures were at 30 mmHg, and I elected not to do that.  A pullback across the aortic valve was then done.  At the conclusion of   the case, a Star close device was used to seal the femoral arteriotomy.  All catheter exchanges were performed over a guidewire.   FINDINGS:  Aortic pressure is 144/77 with a mean of 102, left  ventricular pressure is 145/13 with an end-diastolic pressure of 27.   The right coronary artery is small and nondominant.  It is  angiographically normal.  There is an RV marginal branch from its  proximal portion.   The left mainstem is a short segment.  It trifurcates into the LAD,  ramus intermediate and left circumflex.  There is no significant  angiographic disease.   The LAD is a large-caliber vessel.  It is occluded in its proximal  portion.  There is dye staining suggestive of acute occlusion.   The ramus intermedius is medium caliber.  It has an ostial 50% stenosis.   The left circumflex is large caliber.  It is a dominant vessel.  It  gives off a small OM branch followed by a left posterolateral branch in  the left PDA.  There is no significant angiographic disease in the left  circumflex system.   ASSESSMENT:  1. Anterior ST elevation myocardial infarction secondary to 100%      occlusion of the proximal left anterior descending.  2. Nonobstructive left circumflex disease.  3. Normal nondominant right coronary artery.  4. Elevated left ventricular end-diastolic pressure.   PLAN:  As detailed above, PCI of the proximal LAD was performed with  overlapping bare metal stents.  There was an excellent angiographic  result.  The patient will be continued on aspirin, as well as PLATO  study drug.  He will be admitted to the CCU.  Angiomax was used for  procedural anticoagulation.  It was discontinued at the end of the  procedure.  He will also be started on a beta blocker and an ACE  inhibitor as his blood pressure tolerates.      [name], MD  Electronically Signed    MDC/MEDQ  D:  10/21/2006  T:  10/22/2006  Job:  [id number]

## 2010-12-01 NOTE — Assessment & Plan Note (Signed)
[facility]                                 ON-CALL NOTE   ZACHARY, NOLE                       MRN:          [id number]  DATE:01/13/2008                            DOB:          1945-10-18    I received a call from Mr. Aiken wife this evening stating that  they have been trying to have a lisinopril prescription refilled through  our office and it just has not been done yet.  He was last seen by Dr.  Excell Seltzer in January 5 and has scheduled followup July 20.  I have gone  ahead and called their pharmacy, [facility] at phone number [phone] and called in a prescription for lisinopril 10 mg 1 p.o. daily #30  with 6 refills.  The patient will have a BMET upon follow up with Dr.  Excell Seltzer in July.     Nicolasa Ducking, ANP  Electronically Signed    CB/MedQ  DD: 01/13/2008  DT: 01/14/2008  Job #: [id number]

## 2016-10-30 ENCOUNTER — Emergency Department (HOSPITAL_COMMUNITY): Payer: Medicare Other

## 2016-10-30 ENCOUNTER — Emergency Department (HOSPITAL_COMMUNITY)
Admission: EM | Admit: 2016-10-30 | Discharge: 2016-10-30 | Disposition: A | Discharge status: Home / Self Care | Payer: Medicare Other | Attending: Emergency Medicine | Admitting: Emergency Medicine

## 2016-10-30 ENCOUNTER — Encounter (HOSPITAL_COMMUNITY): Payer: Self-pay

## 2016-10-30 DIAGNOSIS — S0083XA Contusion of other part of head, initial encounter: Secondary | ICD-10-CM | POA: Diagnosis not present

## 2016-10-30 DIAGNOSIS — Y999 Unspecified external cause status: Secondary | ICD-10-CM | POA: Diagnosis not present

## 2016-10-30 DIAGNOSIS — I10 Essential (primary) hypertension: Secondary | ICD-10-CM | POA: Diagnosis not present

## 2016-10-30 DIAGNOSIS — Z87891 Personal history of nicotine dependence: Secondary | ICD-10-CM | POA: Insufficient documentation

## 2016-10-30 DIAGNOSIS — Z955 Presence of coronary angioplasty implant and graft: Secondary | ICD-10-CM | POA: Diagnosis not present

## 2016-10-30 DIAGNOSIS — S60031A Contusion of right middle finger without damage to nail, initial encounter: Secondary | ICD-10-CM

## 2016-10-30 DIAGNOSIS — Y9389 Activity, other specified: Secondary | ICD-10-CM | POA: Diagnosis not present

## 2016-10-30 DIAGNOSIS — S61401A Unspecified open wound of right hand, initial encounter: Secondary | ICD-10-CM | POA: Insufficient documentation

## 2016-10-30 DIAGNOSIS — Y929 Unspecified place or not applicable: Secondary | ICD-10-CM | POA: Insufficient documentation

## 2016-10-30 DIAGNOSIS — S0990XA Unspecified injury of head, initial encounter: Secondary | ICD-10-CM | POA: Diagnosis not present

## 2016-10-30 DIAGNOSIS — S6991XA Unspecified injury of right wrist, hand and finger(s), initial encounter: Secondary | ICD-10-CM | POA: Diagnosis present

## 2016-10-30 HISTORY — DX: Essential (primary) hypertension: I10

## 2016-10-30 HISTORY — DX: Gout, unspecified: M10.9

## 2016-10-30 HISTORY — DX: Gastro-esophageal reflux disease without esophagitis: K21.9

## 2016-10-30 HISTORY — DX: Pure hypercholesterolemia, unspecified: E78.00

## 2016-10-30 HISTORY — DX: Chronic ischemic heart disease, unspecified: I25.9

## 2016-10-30 MED ORDER — SULFAMETHOXAZOLE-TRIMETHOPRIM 800-160 MG PO TABS: 1.0000 | ORAL_TABLET | Freq: Two times a day (BID) | ORAL | 0 refills | Status: AC

## 2016-10-30 MED ORDER — OXYCODONE-ACETAMINOPHEN 5-325 MG PO TABS
1.0000 | ORAL_TABLET | Freq: Once | ORAL | Status: AC
  Administered 2016-10-30: 1 via ORAL
  Filled 2016-10-30: qty 1
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
## 2016-10-30 NOTE — ED Notes (Signed)
Denies neck or back pain.  Ambulatory.  Denies numbness and tingling.  Denies N/V/D.
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
## 2019-04-08 ENCOUNTER — Inpatient Hospital Stay (HOSPITAL_COMMUNITY)
Admission: EM | Admit: 2019-04-08 | Discharge: 2019-04-13 | DRG: 193 | Disposition: A | Discharge status: Home / Self Care | Payer: Medicare PPO | Attending: Internal Medicine | Admitting: Internal Medicine

## 2019-04-08 ENCOUNTER — Encounter (HOSPITAL_COMMUNITY): Payer: Self-pay | Admitting: Emergency Medicine

## 2019-04-08 ENCOUNTER — Emergency Department (HOSPITAL_COMMUNITY): Payer: Medicare PPO

## 2019-04-08 ENCOUNTER — Other Ambulatory Visit: Payer: Self-pay

## 2019-04-08 DIAGNOSIS — M109 Gout, unspecified: Secondary | ICD-10-CM | POA: Diagnosis present

## 2019-04-08 DIAGNOSIS — Z880 Allergy status to penicillin: Secondary | ICD-10-CM

## 2019-04-08 DIAGNOSIS — J9601 Acute respiratory failure with hypoxia: Secondary | ICD-10-CM | POA: Diagnosis present

## 2019-04-08 DIAGNOSIS — E785 Hyperlipidemia, unspecified: Secondary | ICD-10-CM | POA: Diagnosis present

## 2019-04-08 DIAGNOSIS — R042 Hemoptysis: Secondary | ICD-10-CM | POA: Diagnosis present

## 2019-04-08 DIAGNOSIS — E7800 Pure hypercholesterolemia, unspecified: Secondary | ICD-10-CM | POA: Diagnosis present

## 2019-04-08 DIAGNOSIS — Z955 Presence of coronary angioplasty implant and graft: Secondary | ICD-10-CM

## 2019-04-08 DIAGNOSIS — J181 Lobar pneumonia, unspecified organism: Secondary | ICD-10-CM

## 2019-04-08 DIAGNOSIS — J439 Emphysema, unspecified: Secondary | ICD-10-CM | POA: Diagnosis present

## 2019-04-08 DIAGNOSIS — I110 Hypertensive heart disease with heart failure: Secondary | ICD-10-CM | POA: Diagnosis present

## 2019-04-08 DIAGNOSIS — J189 Pneumonia, unspecified organism: Secondary | ICD-10-CM | POA: Diagnosis not present

## 2019-04-08 DIAGNOSIS — J9600 Acute respiratory failure, unspecified whether with hypoxia or hypercapnia: Secondary | ICD-10-CM | POA: Diagnosis present

## 2019-04-08 DIAGNOSIS — Z7901 Long term (current) use of anticoagulants: Secondary | ICD-10-CM

## 2019-04-08 DIAGNOSIS — Z09 Encounter for follow-up examination after completed treatment for conditions other than malignant neoplasm: Secondary | ICD-10-CM

## 2019-04-08 DIAGNOSIS — K219 Gastro-esophageal reflux disease without esophagitis: Secondary | ICD-10-CM | POA: Diagnosis present

## 2019-04-08 DIAGNOSIS — G8929 Other chronic pain: Secondary | ICD-10-CM | POA: Diagnosis present

## 2019-04-08 DIAGNOSIS — Z20828 Contact with and (suspected) exposure to other viral communicable diseases: Secondary | ICD-10-CM | POA: Diagnosis present

## 2019-04-08 DIAGNOSIS — I509 Heart failure, unspecified: Secondary | ICD-10-CM | POA: Diagnosis present

## 2019-04-08 DIAGNOSIS — I10 Essential (primary) hypertension: Secondary | ICD-10-CM | POA: Diagnosis present

## 2019-04-08 DIAGNOSIS — R001 Bradycardia, unspecified: Secondary | ICD-10-CM | POA: Diagnosis present

## 2019-04-08 DIAGNOSIS — M545 Low back pain: Secondary | ICD-10-CM | POA: Diagnosis present

## 2019-04-08 DIAGNOSIS — Z87891 Personal history of nicotine dependence: Secondary | ICD-10-CM

## 2019-04-08 DIAGNOSIS — R0602 Shortness of breath: Secondary | ICD-10-CM | POA: Diagnosis not present

## 2019-04-08 DIAGNOSIS — I2510 Atherosclerotic heart disease of native coronary artery without angina pectoris: Secondary | ICD-10-CM | POA: Diagnosis present

## 2019-04-08 HISTORY — DX: Other forms of dyspnea: R06.09

## 2019-04-08 HISTORY — DX: Pneumonia, unspecified organism: J18.9

## 2019-04-08 HISTORY — DX: Hyperlipidemia, unspecified: E78.5

## 2019-04-08 HISTORY — DX: Dyspnea, unspecified: R06.00

## 2019-04-08 HISTORY — DX: Atherosclerotic heart disease of native coronary artery without angina pectoris: I25.10

## 2019-04-08 HISTORY — DX: Low back pain, unspecified: M54.50

## 2019-04-08 HISTORY — DX: Emphysema, unspecified: J43.9

## 2019-04-08 HISTORY — DX: Acute respiratory failure, unspecified whether with hypoxia or hypercapnia: J96.00

## 2019-04-08 HISTORY — DX: Other chronic pain: G89.29

## 2019-04-08 LAB — TROPONIN I (HIGH SENSITIVITY)
Troponin I (High Sensitivity): 11 ng/L (ref <18)
Troponin I (High Sensitivity): 12 ng/L (ref <18)

## 2019-04-08 LAB — CBC
HCT: 41.2 % (ref 39.0–52.0)
Hemoglobin: 13.1 g/dL (ref 13.0–17.0)
MCH: 32.1 pg (ref 26.0–34.0)
MCHC: 31.8 g/dL (ref 30.0–36.0)
MCV: 101 fL — ABNORMAL HIGH (ref 80.0–100.0)
Platelets: 426 10*3/uL — ABNORMAL HIGH (ref 150–400)
RBC: 4.08 MIL/uL — ABNORMAL LOW (ref 4.22–5.81)
RDW: 14 % (ref 11.5–15.5)
WBC: 8.4 10*3/uL (ref 4.0–10.5)
nRBC: 0 % (ref 0.0–0.2)

## 2019-04-08 LAB — BASIC METABOLIC PANEL
Anion gap: 10 (ref 5–15)
BUN: 25 mg/dL — ABNORMAL HIGH (ref 8–23)
CO2: 25 mmol/L (ref 22–32)
Calcium: 9.2 mg/dL (ref 8.9–10.3)
Chloride: 103 mmol/L (ref 98–111)
Creatinine, Ser: 1.13 mg/dL (ref 0.61–1.24)
GFR calc Af Amer: >60 mL/min (ref >60)
GFR calc non Af Amer: >60 mL/min (ref >60)
Glucose, Bld: 107 mg/dL — ABNORMAL HIGH (ref 70–99)
Potassium: 4.1 mmol/L (ref 3.5–5.1)
Sodium: 138 mmol/L (ref 135–145)

## 2019-04-08 MED ORDER — SODIUM CHLORIDE 0.9 % IV SOLN
500.0000 mg | Freq: Once | INTRAVENOUS | Status: AC
  Administered 2019-04-09: 500 mg via INTRAVENOUS
  Filled 2019-04-08: qty 500

## 2019-04-08 MED ORDER — SODIUM CHLORIDE 0.9 % IV SOLN
1.0000 g | Freq: Once | INTRAVENOUS | Status: AC
  Administered 2019-04-08: 1 g via INTRAVENOUS
  Filled 2019-04-08: qty 10

## 2019-04-08 MED ORDER — SODIUM CHLORIDE 0.9% FLUSH: 3.0000 mL | Freq: Once | INTRAVENOUS | Status: DC

## 2019-04-08 NOTE — ED Notes (Signed)
No answer for vitals recheck x1 

## 2019-04-08 NOTE — ED Provider Notes (Signed)
[facility] EMERGENCY DEPARTMENT Provider Note   CSN: [id number] Arrival date & time: 04/08/19  1341     History   Chief Complaint Chief Complaint  Patient presents with  . Shortness of Breath  . Cough    HPI MAYANK TEUSCHER is a 73 y.o. male.     73 yo M with a chief complaint of shortness of breath on exertion.  This been going on for about a week or so.  Patient has had fevers off and on as well as a cough.  He feels that his sputum has progressively changed over the course of the week.  At the onset he called his family doctor who prescribed him doxycycline as well as Mucinex.  Has not had any improvement with this.  Continued to have fevers at home.  Felt that his shortness of breath has worsened significantly over that time.  He denies any chest pain or pressure with this.  Has had some mild blood-tinged sputum.  He is on Eliquis.  Has been taking this regularly.  Denies history of PE or DVT.  The history is provided by the patient.  Shortness of Breath Severity:  Moderate Onset quality:  Gradual Duration:  1 week Timing:  Constant Progression:  Worsening Chronicity:  New Relieved by:  Nothing Worsened by:  Nothing Ineffective treatments:  None tried Associated symptoms: cough and fever   Associated symptoms: no abdominal pain, no chest pain, no headaches, no rash and no vomiting   Cough Associated symptoms: chills, fever, myalgias and shortness of breath   Associated symptoms: no chest pain, no eye discharge, no headaches and no rash     Past Medical History:  Diagnosis Date  . GERD (gastroesophageal reflux disease)   . Gout   . Hypercholesteremia   . Hypertension   . Myocardial ischemia     There are no active problems to display for this patient.   Past Surgical History:  Procedure Laterality Date  . CORONARY ANGIOPLASTY WITH STENT PLACEMENT     x 3         Home Medications    Prior to Admission medications   Not on File     Family History No family history on file.  Social History Social History   Tobacco Use  . Smoking status: Former Research scientist (life sciences)  . Smokeless tobacco: Never Used  Substance Use Topics  . Alcohol use: Yes  . Drug use: No     Allergies   Penicillins   Review of Systems Review of Systems  Constitutional: Positive for chills and fever.  HENT: Negative for congestion and facial swelling.   Eyes: Negative for discharge and visual disturbance.  Respiratory: Positive for cough and shortness of breath.   Cardiovascular: Negative for chest pain and palpitations.  Gastrointestinal: Negative for abdominal pain, diarrhea and vomiting.  Musculoskeletal: Positive for myalgias. Negative for arthralgias.  Skin: Negative for color change and rash.  Neurological: Negative for tremors, syncope and headaches.  Psychiatric/Behavioral: Negative for confusion and dysphoric mood.     Physical Exam Updated Vital Signs BP (!) 141/62   Pulse (!) 54   Temp 98.8 F (37.1 C) (Oral)   Resp 20   SpO2 93%   Physical Exam Vitals signs and nursing note reviewed.  Constitutional:      Appearance: He is well-developed.  HENT:     Head: Normocephalic and atraumatic.  Eyes:     Pupils: Pupils are equal, round, and reactive to light.  Neck:     Musculoskeletal: Normal range of motion and neck supple.     Vascular: No JVD.  Cardiovascular:     Rate and Rhythm: Normal rate and regular rhythm.     Heart sounds: No murmur. No friction rub. No gallop.   Pulmonary:     Effort: No respiratory distress.     Breath sounds: No wheezing.     Comments: Coarse breath sounds in all fields Abdominal:     General: There is no distension.     Tenderness: There is no guarding or rebound.  Musculoskeletal: Normal range of motion.  Skin:    Coloration: Skin is not pale.     Findings: No rash.  Neurological:     Mental Status: He is alert and oriented to person, place, and time.  Psychiatric:        Behavior:  Behavior normal.      ED Treatments / Results  Labs (all labs ordered are listed, but only abnormal results are displayed) Labs Reviewed  BASIC METABOLIC PANEL - Abnormal; Notable for the following components:      Result Value   Glucose, Bld 107 (*)    BUN 25 (*)    All other components within normal limits  CBC - Abnormal; Notable for the following components:   RBC 4.08 (*)    MCV 101.0 (*)    Platelets 426 (*)    All other components within normal limits  CULTURE, BLOOD (ROUTINE X 2)  CULTURE, BLOOD (ROUTINE X 2)  SARS CORONAVIRUS 2 (HOSPITAL ORDER, [facility])  TROPONIN I (HIGH SENSITIVITY)  TROPONIN I (HIGH SENSITIVITY)    EKG EKG Interpretation  Date/Time:  Wednesday April 08 2019 13:56:32 EDT Ventricular Rate:  47 PR Interval:  178 QRS Duration: 76 QT Interval:  466 QTC Calculation: 412 R Axis:   61 Text Interpretation:  Marked sinus bradycardia Anteroseptal infarct , age undetermined Abnormal ECG No significant change since last tracing Confirmed by Deno Etienne [phone]) on 04/08/2019 9:41:00 PM   Radiology Dg Chest 2 View  Result Date: 04/08/2019 CLINICAL DATA:  Sob. Pt stated that he's been experiencing sob for 1 week w/ a cough. Pt said he started out producing a clear phlegm, then it went yellow-green, then brown, and this morning pt said it was bright red. EXAM: CHEST - 2 VIEW COMPARISON:  Chest radiograph 10/22/2006 FINDINGS: Mediastinal contours are within normal limits. The heart size is enlarged. There is hazy opacification at the right lung base suspicious for infection. The left lung is clear. No pneumothorax or large pleural effusion. No acute finding in the visualized skeleton. IMPRESSION: 1. Hazy opacification at the right lung base suspicious for infection. 2. Cardiomegaly. Electronically Signed   By: Audie Pinto M.D.   On: 04/08/2019 14:36    Procedures Procedures (including critical care time)  Medications  Ordered in ED Medications  sodium chloride flush (NS) 0.9 % injection 3 mL (has no administration in time range)  cefTRIAXone (ROCEPHIN) 1 g in sodium chloride 0.9 % 100 mL IVPB (has no administration in time range)  azithromycin (ZITHROMAX) 500 mg in sodium chloride 0.9 % 250 mL IVPB (has no administration in time range)     Initial Impression / Assessment and Plan / ED Course  I have reviewed the triage vital signs and the nursing notes.  Pertinent labs & imaging results that were available during my care of the patient were reviewed by me and considered in my  medical decision making (see chart for details).        73 yo M with a chief complaint of shortness of breath on exertion.  Going on for about a week.  Worsening despite being on antibiotics.  Chest x-ray here with concern for right lower lobe pneumonia.  No leukocytosis mild BUN elevation.  Based on the patient's curb 65 score he is at moderate risk.  He also has a failure of outpatient antibiotics.  His delta troponin is negative.  Despite his hemoptysis he has been on Eliquis without any missed dosing.  Feel that PE is less likely.    He was able to ambulate a short distance in the ED he did have some hypoxia dipping into the upper 80s.  The patients results and plan were reviewed and discussed.   Any x-rays performed were independently reviewed by myself.   Differential diagnosis were considered with the presenting HPI.  Medications  sodium chloride flush (NS) 0.9 % injection 3 mL (has no administration in time range)  cefTRIAXone (ROCEPHIN) 1 g in sodium chloride 0.9 % 100 mL IVPB (has no administration in time range)  azithromycin (ZITHROMAX) 500 mg in sodium chloride 0.9 % 250 mL IVPB (has no administration in time range)    Vitals:   04/08/19 2102 04/08/19 2127 04/08/19 2130 04/08/19 2145  BP: (!) 190/64 (!) 155/75 (!) 164/68 (!) 141/62  Pulse: (!) 53 (!) 55 (!) 52 (!) 54  Resp: (!) 22  18 20   Temp: 98.8 F  (37.1 C)     TempSrc: Oral     SpO2: 94% (!) 85% 94% 93%    Final diagnoses:  Community acquired pneumonia of right lower lobe of lung ([place])    Admission/ observation were discussed with the admitting physician, patient and/or family and they are comfortable with the plan.    Final Clinical Impressions(s) / ED Diagnoses   Final diagnoses:  Community acquired pneumonia of right lower lobe of lung [facility])    ED Discharge Orders    None       Deno Etienne, DO 04/08/19 2228

## 2019-04-08 NOTE — ED Notes (Signed)
Ambulated Pt: Began at 93% RA, increased work of breathing during ambulation and initially pt O2 went as high as 96% but towards the end pt dropped as low as 88% RA. Pt returned to bed and is currently at 93-94% RA.

## 2019-04-08 NOTE — ED Notes (Signed)
No answer for vitals recheck x2

## 2019-04-08 NOTE — ED Triage Notes (Signed)
C/o SOB x 1 week with productive cough and generalized weakness.  Reports negative COVID test on Friday.  States phlegm is now bright red.

## 2019-04-08 NOTE — H&P (Signed)
History and Physical    Tony Garcia CZY:[id number] DOB: 1945/09/07 DOA: 04/08/2019  PCP: [name], MD Patient coming from: Home  Chief Complaint: Shortness of breath  HPI: Tony Garcia is a 73 y.o. male with medical history significant of CAD status post PCI, hypertension, hyperlipidemia, GERD, gout presenting to the hospital with a chief complaint of shortness of breath.  Patient reports 1 week history of fatigue, dyspnea on minimal exertion, productive cough, and fevers.  States he has had temperatures as high as 103 F at home for which she has taken ibuprofen.  He is coughing up yellow-colored sputum but this morning noticed that there is a very small amount of blood mixed with the sputum.  No gross hemoptysis.  Denies history of blood clots.  States he was checked for COVID 5 days ago and the test was negative.  ED Course: Slightly tachypneic.  Oxygen saturation 88% with ambulation and had increased work of breathing.  Bradycardic with heart rate in the 40s to 50s.  Afebrile and no leukocytosis. Hgb 13.1.  High-sensitivity troponin checked twice negative.  SARS-CoV-2 test pending.  Blood culture x2 pending.  Chest x-ray with right lung base opacification suspicious for pneumonia. Patient received ceftriaxone and azithromycin.  Review of Systems:  All systems reviewed and apart from history of presenting illness, are negative.  Past Medical History:  Diagnosis Date   GERD (gastroesophageal reflux disease)    Gout    Hypercholesteremia    Hypertension    Myocardial ischemia     Past Surgical History:  Procedure Laterality Date   CORONARY ANGIOPLASTY WITH STENT PLACEMENT     x 3      reports that he has quit smoking. He has never used smokeless tobacco. He reports current alcohol use. He reports that he does not use drugs.  Allergies  Allergen Reactions   Penicillins     History reviewed. No pertinent family history.  Prior to Admission  medications   Not on File    Physical Exam: Vitals:   04/08/19 2245 04/08/19 2315 04/08/19 2330 04/08/19 2345  BP: 138/63 124/89 (!) 148/63 118/61  Pulse: (!) 52 (!) 54 (!) 56 (!) 54  Resp: 19 (!) 22 (!) 24 19  Temp:      TempSrc:      SpO2: 91% 94% 94% 94%    Physical Exam  Constitutional: He is oriented to person, place, and time. He appears well-developed and well-nourished. No distress.  HENT:  Head: Normocephalic.  Mouth/Throat: Oropharynx is clear and moist.  Eyes: Right eye exhibits no discharge. Left eye exhibits no discharge.  Neck: Neck supple.  Cardiovascular: Normal rate, regular rhythm and intact distal pulses.  Pulmonary/Chest: He has no wheezes.  Tachypneic with slightly increased work of breathing Able to speak clearly in full sentences Crackles appreciated at the right lung base  Abdominal: Soft. Bowel sounds are normal. He exhibits no distension. There is no abdominal tenderness. There is no guarding.  Musculoskeletal:        General: No edema.  Neurological: He is alert and oriented to person, place, and time.  Skin: Skin is warm and dry. He is not diaphoretic.     Labs on Admission: I have personally reviewed following labs and imaging studies  CBC: Recent Labs  Lab 04/08/19 1436  WBC 8.4  HGB 13.1  HCT 41.2  MCV 101.0*  PLT 093*   Basic Metabolic Panel: Recent Labs  Lab 04/08/19 1436  NA 138  K  4.1  CL 103  CO2 25  GLUCOSE 107*  BUN 25*  CREATININE 1.13  CALCIUM 9.2   GFR: CrCl cannot be calculated (Unknown ideal weight.). Liver Function Tests: No results for input(s): AST, ALT, ALKPHOS, BILITOT, PROT, ALBUMIN in the last 168 hours. No results for input(s): LIPASE, AMYLASE in the last 168 hours. No results for input(s): AMMONIA in the last 168 hours. Coagulation Profile: No results for input(s): INR, PROTIME in the last 168 hours. Cardiac Enzymes: No results for input(s): CKTOTAL, CKMB, CKMBINDEX, TROPONINI in the last 168  hours. BNP (last 3 results) No results for input(s): PROBNP in the last 8760 hours. HbA1C: No results for input(s): HGBA1C in the last 72 hours. CBG: No results for input(s): GLUCAP in the last 168 hours. Lipid Profile: No results for input(s): CHOL, HDL, LDLCALC, TRIG, CHOLHDL, LDLDIRECT in the last 72 hours. Thyroid Function Tests: No results for input(s): TSH, T4TOTAL, FREET4, T3FREE, THYROIDAB in the last 72 hours. Anemia Panel: No results for input(s): VITAMINB12, FOLATE, FERRITIN, TIBC, IRON, RETICCTPCT in the last 72 hours. Urine analysis: No results found for: COLORURINE, APPEARANCEUR, LABSPEC, PHURINE, GLUCOSEU, HGBUR, BILIRUBINUR, KETONESUR, PROTEINUR, UROBILINOGEN, NITRITE, LEUKOCYTESUR  Radiological Exams on Admission: Dg Chest 2 View  Result Date: 04/08/2019 CLINICAL DATA:  Sob. Pt stated that he's been experiencing sob for 1 week w/ a cough. Pt said he started out producing a clear phlegm, then it went yellow-green, then brown, and this morning pt said it was bright red. EXAM: CHEST - 2 VIEW COMPARISON:  Chest radiograph 10/22/2006 FINDINGS: Mediastinal contours are within normal limits. The heart size is enlarged. There is hazy opacification at the right lung base suspicious for infection. The left lung is clear. No pneumothorax or large pleural effusion. No acute finding in the visualized skeleton. IMPRESSION: 1. Hazy opacification at the right lung base suspicious for infection. 2. Cardiomegaly. Electronically Signed   By: Audie Pinto M.D.   On: 04/08/2019 14:36    EKG: Independently reviewed.  Sinus bradycardia, heart rate 47.  Assessment/Plan Principal Problem:   CAP (community acquired pneumonia) Active Problems:   Bradycardia   Essential hypertension   Community-acquired pneumonia Slightly tachypneic.  Oxygen saturation in the mid 90s at rest, however, dropped to 88% with ambulation.  Afebrile and no leukocytosis.  Chest x-ray with right lung base  opacification suspicious for pneumonia.  Patient reports 1 episode of noticing a small amount of blood mixed with sputum.  Denies history of blood clots.  Patient reports taking Eliquis at home, no records available as he is a [place] patient.  Hemoglobin stable at 13.1. -Continue ceftriaxone and azithromycin -Mucinex DM -SARS-CoV-2 test pending -Blood culture x2 pending -Continuous pulse ox  Sinus bradycardia Bradycardic with heart rate in the 40s to 50s.  Patient is asymptomatic.  Does have history of CAD, unclear if he is on a beta-blocker.  [facility] is a [facility] and his medication history is not on file. -Cardiac monitoring -Check TSH, free T4 levels -Pharmacy med rec pending  Hypertension Currently normotensive. -Hydralazine PRN  DVT prophylaxis: SCDs at this time, takes Eliquis at home and pharmacy will be obtaining records from the [place] in the morning Code Status: Patient wishes to be full code. Family Communication: No family available. Disposition Plan: Anticipate discharge after clinical improvement. Consults called: None Admission status: It is my clinical opinion that referral for OBSERVATION is reasonable and necessary in this patient based on the above information provided. The aforementioned taken together are felt to  place the patient at high risk for further clinical deterioration. However it is anticipated that the patient may be medically stable for discharge from the hospital within 24 to 48 hours.  The medical decision making on this patient was of high complexity and the patient is at high risk for clinical deterioration, therefore this is a level 3 visit.  Shela Leff MD [facility] Pager [phone]  If 7PM-7AM, please contact night-coverage [email or web address] Password [email or web address]  04/09/2019, 1:04 AM

## 2019-04-08 NOTE — ED Notes (Signed)
Complaining of being short of breath and being upset for his wait. Patient is eating [employer] as he is expressing this to me

## 2019-04-09 ENCOUNTER — Encounter (HOSPITAL_COMMUNITY): Payer: Self-pay | Admitting: Internal Medicine

## 2019-04-09 DIAGNOSIS — G8929 Other chronic pain: Secondary | ICD-10-CM | POA: Diagnosis present

## 2019-04-09 DIAGNOSIS — J9601 Acute respiratory failure with hypoxia: Secondary | ICD-10-CM | POA: Diagnosis present

## 2019-04-09 DIAGNOSIS — E785 Hyperlipidemia, unspecified: Secondary | ICD-10-CM | POA: Diagnosis present

## 2019-04-09 DIAGNOSIS — J181 Lobar pneumonia, unspecified organism: Secondary | ICD-10-CM | POA: Diagnosis not present

## 2019-04-09 DIAGNOSIS — I10 Essential (primary) hypertension: Secondary | ICD-10-CM | POA: Diagnosis not present

## 2019-04-09 DIAGNOSIS — I340 Nonrheumatic mitral (valve) insufficiency: Secondary | ICD-10-CM | POA: Diagnosis not present

## 2019-04-09 DIAGNOSIS — J439 Emphysema, unspecified: Secondary | ICD-10-CM | POA: Diagnosis present

## 2019-04-09 DIAGNOSIS — Z955 Presence of coronary angioplasty implant and graft: Secondary | ICD-10-CM | POA: Diagnosis not present

## 2019-04-09 DIAGNOSIS — Z87891 Personal history of nicotine dependence: Secondary | ICD-10-CM | POA: Diagnosis not present

## 2019-04-09 DIAGNOSIS — I2510 Atherosclerotic heart disease of native coronary artery without angina pectoris: Secondary | ICD-10-CM | POA: Diagnosis present

## 2019-04-09 DIAGNOSIS — M545 Low back pain, unspecified: Secondary | ICD-10-CM | POA: Diagnosis present

## 2019-04-09 DIAGNOSIS — I509 Heart failure, unspecified: Secondary | ICD-10-CM | POA: Diagnosis present

## 2019-04-09 DIAGNOSIS — Z20828 Contact with and (suspected) exposure to other viral communicable diseases: Secondary | ICD-10-CM | POA: Diagnosis present

## 2019-04-09 DIAGNOSIS — J189 Pneumonia, unspecified organism: Secondary | ICD-10-CM | POA: Diagnosis present

## 2019-04-09 DIAGNOSIS — M109 Gout, unspecified: Secondary | ICD-10-CM | POA: Diagnosis present

## 2019-04-09 DIAGNOSIS — R042 Hemoptysis: Secondary | ICD-10-CM | POA: Diagnosis present

## 2019-04-09 DIAGNOSIS — E7800 Pure hypercholesterolemia, unspecified: Secondary | ICD-10-CM | POA: Diagnosis present

## 2019-04-09 DIAGNOSIS — R001 Bradycardia, unspecified: Secondary | ICD-10-CM | POA: Diagnosis present

## 2019-04-09 DIAGNOSIS — R0602 Shortness of breath: Secondary | ICD-10-CM | POA: Diagnosis present

## 2019-04-09 DIAGNOSIS — Z7901 Long term (current) use of anticoagulants: Secondary | ICD-10-CM | POA: Diagnosis not present

## 2019-04-09 DIAGNOSIS — I259 Chronic ischemic heart disease, unspecified: Secondary | ICD-10-CM | POA: Insufficient documentation

## 2019-04-09 DIAGNOSIS — Z880 Allergy status to penicillin: Secondary | ICD-10-CM | POA: Diagnosis not present

## 2019-04-09 DIAGNOSIS — K219 Gastro-esophageal reflux disease without esophagitis: Secondary | ICD-10-CM | POA: Diagnosis present

## 2019-04-09 DIAGNOSIS — I110 Hypertensive heart disease with heart failure: Secondary | ICD-10-CM | POA: Diagnosis present

## 2019-04-09 DIAGNOSIS — J9600 Acute respiratory failure, unspecified whether with hypoxia or hypercapnia: Secondary | ICD-10-CM | POA: Diagnosis present

## 2019-04-09 LAB — SARS CORONAVIRUS 2 BY RT PCR (HOSPITAL ORDER, PERFORMED IN ~~LOC~~ HOSPITAL LAB): SARS Coronavirus 2: NEGATIVE

## 2019-04-09 LAB — TSH: TSH: 5.512 u[IU]/mL — ABNORMAL HIGH (ref 0.350–4.500)

## 2019-04-09 LAB — BRAIN NATRIURETIC PEPTIDE: B Natriuretic Peptide: 146.3 pg/mL — ABNORMAL HIGH (ref 0.0–100.0)

## 2019-04-09 LAB — T4, FREE: Free T4: 1.03 ng/dL (ref 0.61–1.12)

## 2019-04-09 MED ORDER — HYDRALAZINE HCL 20 MG/ML IJ SOLN: 5.0000 mg | INTRAMUSCULAR | Status: DC | PRN

## 2019-04-09 MED ORDER — FLUTICASONE PROPIONATE 50 MCG/ACT NA SUSP
1.0000 | Freq: Every day | NASAL | Status: DC | PRN
  Filled 2019-04-09: qty 16

## 2019-04-09 MED ORDER — GUAIFENESIN 200 MG PO TABS
400.0000 mg | ORAL_TABLET | ORAL | Status: DC | PRN
  Filled 2019-04-09: qty 2

## 2019-04-09 MED ORDER — HYPROMELLOSE (GONIOSCOPIC) 2.5 % OP SOLN
1.0000 [drp] | Freq: Three times a day (TID) | OPHTHALMIC | Status: DC | PRN
  Filled 2019-04-09: qty 15

## 2019-04-09 MED ORDER — SODIUM CHLORIDE 0.9 % IV SOLN
1.0000 g | INTRAVENOUS | Status: DC
  Administered 2019-04-09 – 2019-04-12 (×4): 1 g via INTRAVENOUS
  Filled 2019-04-09: qty 1
  Filled 2019-04-09 (×5): qty 10

## 2019-04-09 MED ORDER — IPRATROPIUM-ALBUTEROL 0.5-2.5 (3) MG/3ML IN SOLN
3.0000 mL | Freq: Three times a day (TID) | RESPIRATORY_TRACT | Status: DC
  Administered 2019-04-10 – 2019-04-11 (×4): 3 mL via RESPIRATORY_TRACT
  Filled 2019-04-09 (×4): qty 3

## 2019-04-09 MED ORDER — IPRATROPIUM-ALBUTEROL 0.5-2.5 (3) MG/3ML IN SOLN
3.0000 mL | Freq: Four times a day (QID) | RESPIRATORY_TRACT | Status: DC
  Administered 2019-04-09 (×3): 3 mL via RESPIRATORY_TRACT
  Filled 2019-04-09 (×3): qty 3

## 2019-04-09 MED ORDER — ACETAMINOPHEN 650 MG RE SUPP: 650.0000 mg | Freq: Four times a day (QID) | RECTAL | Status: DC | PRN

## 2019-04-09 MED ORDER — CLOPIDOGREL BISULFATE 75 MG PO TABS
75.0000 mg | ORAL_TABLET | Freq: Every day | ORAL | Status: DC
  Administered 2019-04-09 – 2019-04-13 (×5): 75 mg via ORAL
  Filled 2019-04-09 (×5): qty 1

## 2019-04-09 MED ORDER — EZETIMIBE 10 MG PO TABS
5.0000 mg | ORAL_TABLET | Freq: Every day | ORAL | Status: DC
  Administered 2019-04-09 – 2019-04-13 (×5): 5 mg via ORAL
  Filled 2019-04-09 (×5): qty 1

## 2019-04-09 MED ORDER — METHYLPREDNISOLONE SODIUM SUCC 125 MG IJ SOLR
60.0000 mg | Freq: Two times a day (BID) | INTRAMUSCULAR | Status: DC
  Administered 2019-04-09 – 2019-04-10 (×3): 60 mg via INTRAVENOUS
  Filled 2019-04-09 (×4): qty 2

## 2019-04-09 MED ORDER — ASPIRIN EC 81 MG PO TBEC
81.0000 mg | DELAYED_RELEASE_TABLET | Freq: Every day | ORAL | Status: DC
  Administered 2019-04-09 – 2019-04-13 (×5): 81 mg via ORAL
  Filled 2019-04-09 (×5): qty 1

## 2019-04-09 MED ORDER — DM-GUAIFENESIN ER 30-600 MG PO TB12
1.0000 | ORAL_TABLET | Freq: Two times a day (BID) | ORAL | Status: DC
  Administered 2019-04-09 – 2019-04-13 (×9): 1 via ORAL
  Filled 2019-04-09 (×2): qty 1
  Filled 2019-04-09: qty 2
  Filled 2019-04-09 (×7): qty 1

## 2019-04-09 MED ORDER — ACETAMINOPHEN 325 MG PO TABS: 650.0000 mg | ORAL_TABLET | Freq: Four times a day (QID) | ORAL | Status: DC | PRN

## 2019-04-09 MED ORDER — ENOXAPARIN SODIUM 40 MG/0.4ML ~~LOC~~ SOLN: 40.0000 mg | SUBCUTANEOUS | Status: DC

## 2019-04-09 MED ORDER — PANTOPRAZOLE SODIUM 40 MG PO TBEC
40.0000 mg | DELAYED_RELEASE_TABLET | Freq: Every day | ORAL | Status: DC
  Administered 2019-04-09 – 2019-04-13 (×5): 40 mg via ORAL
  Filled 2019-04-09 (×5): qty 1

## 2019-04-09 MED ORDER — GUAIFENESIN 400 MG PO TABS: 400.0000 mg | ORAL_TABLET | ORAL | Status: DC | PRN

## 2019-04-09 MED ORDER — SODIUM CHLORIDE 0.9 % IV SOLN
500.0000 mg | INTRAVENOUS | Status: DC
  Administered 2019-04-09 – 2019-04-11 (×3): 500 mg via INTRAVENOUS
  Filled 2019-04-09 (×4): qty 500

## 2019-04-09 MED ORDER — ALBUTEROL SULFATE (2.5 MG/3ML) 0.083% IN NEBU: 2.5000 mg | INHALATION_SOLUTION | Freq: Four times a day (QID) | RESPIRATORY_TRACT | Status: DC | PRN

## 2019-04-09 NOTE — ED Notes (Signed)
Secretary notified to obtain last echo from [place]

## 2019-04-09 NOTE — ED Notes (Signed)
Lunch Tray Ordered @ 1053.  

## 2019-04-09 NOTE — ED Notes (Addendum)
ED TO INPATIENT HANDOFF REPORT  ED Nurse Name and Phone #:  Nigel Mormon [phone]  S Name/Age/Gender Tony Garcia 73 y.o. male Room/Bed: 028C/028C  Code Status   Code Status: Full Code  Home/SNF/Other Home Patient oriented to: self, place, time and situation Is this baseline? Yes   Triage Complete: Triage complete  Chief Complaint SHOB, Coughing up blood, General weakness  Triage Note C/o SOB x 1 week with productive cough and generalized weakness.  Reports negative COVID test on Friday.  States phlegm is now bright red.   Allergies Allergies  Allergen Reactions  . Penicillins     Level of Care/Admitting Diagnosis ED Disposition    ED Disposition Condition [facility] Area: [place] [[id number]]  Level of Care: Telemetry Medical [104]  I expect the patient will be discharged within 24 hours: Yes  LOW acuity---Tx typically complete <24 hrs---ACUTE conditions typically can be evaluated <24 hours---LABS likely to return to acceptable levels <24 hours---IS near functional baseline---EXPECTED to return to current living arrangement---NOT newly hypoxic: Meets criteria for 5C-Observation unit  Covid Evaluation: Confirmed COVID Negative  Diagnosis: CAP (community acquired pneumonia) [242683]  Admitting Physician: Shela Leff [[id number]]  Attending Physician: Shela Leff [[id number]]  PT Class (Do Not Modify): Observation [104]  PT Acc Code (Do Not Modify): Observation [10022]       B Medical/Surgery History Past Medical History:  Diagnosis Date  . GERD (gastroesophageal reflux disease)   . Gout   . Hypercholesteremia   . Hypertension   . Myocardial ischemia    Past Surgical History:  Procedure Laterality Date  . CORONARY ANGIOPLASTY WITH STENT PLACEMENT     x 3      A IV Location/Drains/Wounds Patient Lines/Drains/Airways Status   Active Line/Drains/Airways    Name:   Placement date:   Placement time:   Site:    Days:   Peripheral IV 04/08/19 Left Antecubital   04/08/19    2252    Antecubital   1          Intake/Output Last 24 hours No intake or output data in the 24 hours ending 04/09/19 0342  Labs/Imaging Results for orders placed or performed during the hospital encounter of 04/08/19 (from the past 48 hour(s))  Basic metabolic panel     Status: Abnormal   Collection Time: 04/08/19  2:36 PM  Result Value Ref Range   Sodium 138 135 - 145 mmol/L   Potassium 4.1 3.5 - 5.1 mmol/L   Chloride 103 98 - 111 mmol/L   CO2 25 22 - 32 mmol/L   Glucose, Bld 107 (H) 70 - 99 mg/dL   BUN 25 (H) 8 - 23 mg/dL   Creatinine, Ser 1.13 0.61 - 1.24 mg/dL   Calcium 9.2 8.9 - 10.3 mg/dL   GFR calc non Af Amer >60 >60 mL/min   GFR calc Af Amer >60 >60 mL/min   Anion gap 10 5 - 15    Comment: Performed at [facility], [place] [address], [place], [place] [address]  CBC     Status: Abnormal   Collection Time: 04/08/19  2:36 PM  Result Value Ref Range   WBC 8.4 4.0 - 10.5 K/uL   RBC 4.08 (L) 4.22 - 5.81 MIL/uL   Hemoglobin 13.1 13.0 - 17.0 g/dL   HCT 41.2 39.0 - 52.0 %   MCV 101.0 (H) 80.0 - 100.0 fL   MCH 32.1 26.0 - 34.0 pg   MCHC  31.8 30.0 - 36.0 g/dL   RDW 14.0 11.5 - 15.5 %   Platelets 426 (H) 150 - 400 K/uL   nRBC 0.0 0.0 - 0.2 %    Comment: Performed at [facility] [address], [place], [place] [address]  Troponin I (High Sensitivity)     Status: None   Collection Time: 04/08/19  2:36 PM  Result Value Ref Range   Troponin I (High Sensitivity) 11 <18 ng/L    Comment: (NOTE) Elevated high sensitivity troponin I (hsTnI) values and significant  changes across serial measurements may suggest ACS but many other  chronic and acute conditions are known to elevate hsTnI results.  Refer to the "Links" section for chest pain algorithms and additional  guidance. Performed at [facility], [place] [address] [place], [place] [address]   Troponin I (High Sensitivity)     Status: None    Collection Time: 04/08/19  7:46 PM  Result Value Ref Range   Troponin I (High Sensitivity) 12 <18 ng/L    Comment: (NOTE) Elevated high sensitivity troponin I (hsTnI) values and significant  changes across serial measurements may suggest ACS but many other  chronic and acute conditions are known to elevate hsTnI results.  Refer to the "Links" section for chest pain algorithms and additional  guidance. Performed at [facility], [place] [address] [place], [place] [address]   SARS Coronavirus 2 [facility] order, Performed in [facility] hospital lab) Nasopharyngeal Nasopharyngeal Swab     Status: None   Collection Time: 04/08/19 11:30 PM   Specimen: Nasopharyngeal Swab  Result Value Ref Range   SARS Coronavirus 2 NEGATIVE NEGATIVE    Comment: (NOTE) If result is NEGATIVE SARS-CoV-2 target nucleic acids are NOT DETECTED. The SARS-CoV-2 RNA is generally detectable in upper and lower  respiratory specimens during the acute phase of infection. The lowest  concentration of SARS-CoV-2 viral copies this assay can detect is 250  copies / mL. A negative result does not preclude SARS-CoV-2 infection  and should not be used as the sole basis for treatment or other  patient management decisions.  A negative result may occur with  improper specimen collection / handling, submission of specimen other  than nasopharyngeal swab, presence of viral mutation(s) within the  areas targeted by this assay, and inadequate number of viral copies  (<250 copies / mL). A negative result must be combined with clinical  observations, patient history, and epidemiological information. If result is POSITIVE SARS-CoV-2 target nucleic acids are DETECTED. The SARS-CoV-2 RNA is generally detectable in upper and lower  respiratory specimens dur ing the acute phase of infection.  Positive  results are indicative of active infection with SARS-CoV-2.  Clinical  correlation with patient history and other diagnostic  information is  necessary to determine patient infection status.  Positive results do  not rule out bacterial infection or co-infection with other viruses. If result is PRESUMPTIVE POSTIVE SARS-CoV-2 nucleic acids MAY BE PRESENT.   A presumptive positive result was obtained on the submitted specimen  and confirmed on repeat testing.  While 2019 novel coronavirus  (SARS-CoV-2) nucleic acids may be present in the submitted sample  additional confirmatory testing may be necessary for epidemiological  and / or clinical management purposes  to differentiate between  SARS-CoV-2 and other Sarbecovirus currently known to infect humans.  If clinically indicated additional testing with an alternate test  methodology [phone]) is advised. The SARS-CoV-2 RNA is generally  detectable in upper  and lower respiratory sp ecimens during the acute  phase of infection. The expected result is Negative. Fact Sheet for Patients:  StrictlyIdeas.no Fact Sheet for Healthcare Providers: BankingDealers.co.za This test is not yet approved or cleared by the [place] FDA and has been authorized for detection and/or diagnosis of SARS-CoV-2 by FDA under an Emergency Use Authorization (EUA).  This EUA will remain in effect (meaning this test can be used) for the duration of the COVID-19 declaration under Section 564(b)(1) of the Act, 21 U.S.C. section 360bbb-3(b)(1), unless the authorization is terminated or revoked sooner. Performed at [facility], [place] [address], [place], [place] [address]    Dg Chest 2 View  Result Date: 04/08/2019 CLINICAL DATA:  Sob. Pt stated that he's been experiencing sob for 1 week w/ a cough. Pt said he started out producing a clear phlegm, then it went yellow-green, then brown, and this morning pt said it was bright red. EXAM: CHEST - 2 VIEW COMPARISON:  Chest radiograph 10/22/2006 FINDINGS: Mediastinal contours are within normal  limits. The heart size is enlarged. There is hazy opacification at the right lung base suspicious for infection. The left lung is clear. No pneumothorax or large pleural effusion. No acute finding in the visualized skeleton. IMPRESSION: 1. Hazy opacification at the right lung base suspicious for infection. 2. Cardiomegaly. Electronically Signed   By: Audie Pinto M.D.   On: 04/08/2019 14:36    Pending Labs Unresulted Labs (From admission, onward)    Start     Ordered   04/09/19 0500  TSH  Tomorrow morning,   R     04/09/19 0004   04/09/19 0500  T4, free  Tomorrow morning,   R     04/09/19 0004   04/08/19 2209  Blood culture (routine x 2)  BLOOD CULTURE X 2,   STAT     04/08/19 2209          Vitals/Pain Today's Vitals   04/09/19 0130 04/09/19 0200 04/09/19 0230 04/09/19 0305  BP: 120/60 106/90 (!) 114/59 (!) 117/53  Pulse: (!) 57 (!) 58 (!) 55 (!) 50  Resp: (!) 22 (!) 24 (!) 25 (!) 21  Temp:      TempSrc:      SpO2: 94% 96% 93% 96%  PainSc:        Isolation Precautions No active isolations  Medications Medications  sodium chloride flush (NS) 0.9 % injection 3 mL (has no administration in time range)  cefTRIAXone (ROCEPHIN) 1 g in sodium chloride 0.9 % 100 mL IVPB (has no administration in time range)  azithromycin (ZITHROMAX) 500 mg in sodium chloride 0.9 % 250 mL IVPB (has no administration in time range)  acetaminophen (TYLENOL) tablet 650 mg (has no administration in time range)    Or  acetaminophen (TYLENOL) suppository 650 mg (has no administration in time range)  dextromethorphan-guaiFENesin (MUCINEX DM) 30-600 MG per 12 hr tablet 1 tablet (1 tablet Oral Not Given 04/09/19 0129)  hydrALAZINE (APRESOLINE) injection 5 mg (has no administration in time range)  cefTRIAXone (ROCEPHIN) 1 g in sodium chloride 0.9 % 100 mL IVPB (0 g Intravenous Stopped 04/09/19 0000)  azithromycin (ZITHROMAX) 500 mg in sodium chloride 0.9 % 250 mL IVPB (0 mg Intravenous Stopped 04/09/19  0128)    Mobility walks Low fall risk   Focused Assessments Pulmonary Assessment Handoff:  Lung sounds:  coarse breath sounds throughout, but no adventitious sounds  O2 Device: nasal cannula  R Recommendations: See Admitting Provider Note  Report given to:   Additional Notes:  Patient c/o shortness of breath and productive cough x 1 week with intermittent fevers/chills - states he's been dealing with this for a while off and on, was treated with doxycycline and cough medicine by his doctor with some relief, but c/o worsening dyspnea on exertion - SpO2 dropped with ambulating short distance. Increased respiratory effort after ambulating, improved with 2L O2 nasal cannula. Denies chest pain. A&O x 4, independent.

## 2019-04-09 NOTE — ED Notes (Signed)
TELE 

## 2019-04-09 NOTE — ED Notes (Signed)
Breakfast Ordered 

## 2019-04-09 NOTE — Progress Notes (Signed)
[facility] PROGRESS NOTE  Tony Garcia YTK:[id number] DOB: 1946-02-21 DOA: 04/08/2019 PCP: [name], MD  Assessment/Plan:  #1. Acute respiratory failure related to CAP in setting of underlying lung disease. Oxygen saturation level dropped 88% with ambulation. Not on home oxygen but has "rescue inhaler". Oxygen saturation level greater 90% at rest on room air. Chest xray with hazy opacification at the right lung base suspicious for infection. He is afebrile, non-toxic appearing -oxygen supplementation -solumedrol -scheduled nebs -rocephin IV  -azithromycin IV -will retest oxygen sats with ambulation again tomorrow  #2. CAP/cough with hemoptysis/DOE. Likely related to vigorous productive cough in setting of CAP.  Chest xray as noted above. Hypoxia with ambulation. Chart review indicates hx DOE.  Vital signs stable. COVID negative. Reports no blood thinners on med list (still waiting for list from [place]). He is afebrile and non-toxic appearing, no leukocytosis -follow blood cultures -strep pneumo urine -azithromycin and rocephin as noted above -see #1 -if no improvement consider CT chest rule out PE  #3. Bradycardia. HR range 49-55. Unverified med include low dose coreg. Hx cad, hf. EKG with SB no change from previous. No chest pain. tsh 5.62free t4 1.03.  -monitor -OP follow up  #4. Hypertension. Home meds not verified but chart review indicates he take coreg and lisinopril and imdur. Currently controlled -resume home meds once verified -prn hydralazine  #5. CAD s/p stents. Chart review indicates hx multiple PCI's most recent 2017. Coronary angiography at [facility] revealed small branch vessel disease. Cards note indicates he is [place] Cardiovascular Socidty class I for angina and [place] Heart Association class I for heart failure. No chest pain. HS trops negative. EKG as noted above -resume home meds once verified  #6. Dyslipidemia.  -continue home statin  once meds verified  #7. Emphysema. Patient reports hx of emphysema and has "rescue inhaler". Not on oxygen at home.  -See #1  #8. Chronic low back pain. Stable at baseline  Code Status: full Family Communication: none Disposition Plan: home when respiratory effort and oxygen saturation level at baseline   Consultants:    Procedures:    Antibiotics:  Rocephin 9/24>>>  Azithromycin 9/24>>  HPI/Subjective: Sitting on side of bed eating. Reports feeling better but not really breathing better.   Objective: Vitals:   04/09/19 0925 04/09/19 0933  BP:  (!) 115/58  Pulse: (!) 46 (!) 49  Resp: 20 13  Temp:    SpO2: 93% 94%   No intake or output data in the 24 hours ending 04/09/19 0959 There were no vitals filed for this visit.  Exam:   General:  Obese alert no acute distress  Cardiovascular: bradycardia HS distant, no mgr 1+LE edema bilaterally  Respiratory: mild increased work of breathing with eating and conversation. BS quite diminished through out both bases. Clearer upper lobes no crackles or wheeze  Abdomen: obese soft +BS no guarding or rebounding  Musculoskeletal: joints without swelling/erythema   Data Reviewed: Basic Metabolic Panel: Recent Labs  Lab 04/08/19 1436  NA 138  K 4.1  CL 103  CO2 25  GLUCOSE 107*  BUN 25*  CREATININE 1.13  CALCIUM 9.2   Liver Function Tests: No results for input(s): AST, ALT, ALKPHOS, BILITOT, PROT, ALBUMIN in the last 168 hours. No results for input(s): LIPASE, AMYLASE in the last 168 hours. No results for input(s): AMMONIA in the last 168 hours. CBC: Recent Labs  Lab 04/08/19 1436  WBC 8.4  HGB 13.1  HCT 41.2  MCV 101.0*  PLT  426*   Cardiac Enzymes: No results for input(s): CKTOTAL, CKMB, CKMBINDEX, TROPONINI in the last 168 hours. BNP (last 3 results) No results for input(s): BNP in the last 8760 hours.  ProBNP (last 3 results) No results for input(s): PROBNP in the last 8760 hours.  CBG: No  results for input(s): GLUCAP in the last 168 hours.  Recent Results (from the past 240 hour(s))  SARS Coronavirus 2 [facility] order, Performed in [facility] hospital lab) Nasopharyngeal Nasopharyngeal Swab     Status: None   Collection Time: 04/08/19 11:30 PM   Specimen: Nasopharyngeal Swab  Result Value Ref Range Status   SARS Coronavirus 2 NEGATIVE NEGATIVE Final    Comment: (NOTE) If result is NEGATIVE SARS-CoV-2 target nucleic acids are NOT DETECTED. The SARS-CoV-2 RNA is generally detectable in upper and lower  respiratory specimens during the acute phase of infection. The lowest  concentration of SARS-CoV-2 viral copies this assay can detect is 250  copies / mL. A negative result does not preclude SARS-CoV-2 infection  and should not be used as the sole basis for treatment or other  patient management decisions.  A negative result may occur with  improper specimen collection / handling, submission of specimen other  than nasopharyngeal swab, presence of viral mutation(s) within the  areas targeted by this assay, and inadequate number of viral copies  (<250 copies / mL). A negative result must be combined with clinical  observations, patient history, and epidemiological information. If result is POSITIVE SARS-CoV-2 target nucleic acids are DETECTED. The SARS-CoV-2 RNA is generally detectable in upper and lower  respiratory specimens dur ing the acute phase of infection.  Positive  results are indicative of active infection with SARS-CoV-2.  Clinical  correlation with patient history and other diagnostic information is  necessary to determine patient infection status.  Positive results do  not rule out bacterial infection or co-infection with other viruses. If result is PRESUMPTIVE POSTIVE SARS-CoV-2 nucleic acids MAY BE PRESENT.   A presumptive positive result was obtained on the submitted specimen  and confirmed on repeat testing.  While 2019 novel coronavirus  (SARS-CoV-2)  nucleic acids may be present in the submitted sample  additional confirmatory testing may be necessary for epidemiological  and / or clinical management purposes  to differentiate between  SARS-CoV-2 and other Sarbecovirus currently known to infect humans.  If clinically indicated additional testing with an alternate test  methodology [phone]) is advised. The SARS-CoV-2 RNA is generally  detectable in upper and lower respiratory sp ecimens during the acute  phase of infection. The expected result is Negative. Fact Sheet for Patients:  StrictlyIdeas.no Fact Sheet for Healthcare Providers: BankingDealers.co.za This test is not yet approved or cleared by the [place] FDA and has been authorized for detection and/or diagnosis of SARS-CoV-2 by FDA under an Emergency Use Authorization (EUA).  This EUA will remain in effect (meaning this test can be used) for the duration of the COVID-19 declaration under Section 564(b)(1) of the Act, 21 U.S.C. section 360bbb-3(b)(1), unless the authorization is terminated or revoked sooner. Performed at [facility], [place] [address], [place], [place] [address]      Studies: Dg Chest 2 View  Result Date: 04/08/2019 CLINICAL DATA:  Sob. Pt stated that he's been experiencing sob for 1 week w/ a cough. Pt said he started out producing a clear phlegm, then it went yellow-green, then brown, and this morning pt said it was bright red. EXAM: CHEST - 2 VIEW COMPARISON:  Chest radiograph 10/22/2006 FINDINGS: Mediastinal contours are within normal limits. The heart size is enlarged. There is hazy opacification at the right lung base suspicious for infection. The left lung is clear. No pneumothorax or large pleural effusion. No acute finding in the visualized skeleton. IMPRESSION: 1. Hazy opacification at the right lung base suspicious for infection. 2. Cardiomegaly. Electronically Signed   By: Audie Pinto M.D.    On: 04/08/2019 14:36    Scheduled Meds: . dextromethorphan-guaiFENesin  1 tablet Oral BID  . ipratropium-albuterol  3 mL Nebulization Q6H  . methylPREDNISolone (SOLU-MEDROL) injection  60 mg Intravenous Q12H  . sodium chloride flush  3 mL Intravenous Once   Continuous Infusions: . azithromycin    . cefTRIAXone (ROCEPHIN)  IV      Principal Problem:   Acute respiratory failure (HCC) Active Problems:   CAP (community acquired pneumonia)   Bradycardia   Essential hypertension   Cough with hemoptysis   CAD (coronary artery disease)   Dyslipidemia   Emphysema lung (HCC)   Chronic low back pain    Time spent: 21 minute    [facility] M NP [facility]  If 7PM-7AM, please contact night-coverage at [email or web address], password [facility] 04/09/2019, 9:59 AM  LOS: 0 days

## 2019-04-09 NOTE — Plan of Care (Signed)

## 2019-04-10 ENCOUNTER — Inpatient Hospital Stay (HOSPITAL_COMMUNITY): Payer: Medicare PPO

## 2019-04-10 ENCOUNTER — Other Ambulatory Visit: Payer: Self-pay

## 2019-04-10 DIAGNOSIS — R042 Hemoptysis: Secondary | ICD-10-CM

## 2019-04-10 DIAGNOSIS — I340 Nonrheumatic mitral (valve) insufficiency: Secondary | ICD-10-CM

## 2019-04-10 DIAGNOSIS — J439 Emphysema, unspecified: Secondary | ICD-10-CM

## 2019-04-10 LAB — CBC
HCT: 38.2 % — ABNORMAL LOW (ref 39.0–52.0)
Hemoglobin: 12.3 g/dL — ABNORMAL LOW (ref 13.0–17.0)
MCH: 32.3 pg (ref 26.0–34.0)
MCHC: 32.2 g/dL (ref 30.0–36.0)
MCV: 100.3 fL — ABNORMAL HIGH (ref 80.0–100.0)
Platelets: 458 10*3/uL — ABNORMAL HIGH (ref 150–400)
RBC: 3.81 MIL/uL — ABNORMAL LOW (ref 4.22–5.81)
RDW: 13.8 % (ref 11.5–15.5)
WBC: 11.9 10*3/uL — ABNORMAL HIGH (ref 4.0–10.5)
nRBC: 0 % (ref 0.0–0.2)

## 2019-04-10 LAB — BASIC METABOLIC PANEL
Anion gap: 8 (ref 5–15)
BUN: 23 mg/dL (ref 8–23)
CO2: 23 mmol/L (ref 22–32)
Calcium: 8.7 mg/dL — ABNORMAL LOW (ref 8.9–10.3)
Chloride: 106 mmol/L (ref 98–111)
Creatinine, Ser: 0.86 mg/dL (ref 0.61–1.24)
GFR calc Af Amer: >60 mL/min (ref >60)
GFR calc non Af Amer: >60 mL/min (ref >60)
Glucose, Bld: 172 mg/dL — ABNORMAL HIGH (ref 70–99)
Potassium: 4.6 mmol/L (ref 3.5–5.1)
Sodium: 137 mmol/L (ref 135–145)

## 2019-04-10 LAB — ECHOCARDIOGRAM COMPLETE
Height: 64 in
Weight: 3446.23 oz

## 2019-04-10 LAB — STREP PNEUMONIAE URINARY ANTIGEN: Strep Pneumo Urinary Antigen: NEGATIVE

## 2019-04-10 MED ORDER — LISINOPRIL 20 MG PO TABS
20.0000 mg | ORAL_TABLET | Freq: Every day | ORAL | Status: DC
  Administered 2019-04-10 – 2019-04-11 (×2): 20 mg via ORAL
  Filled 2019-04-10 (×2): qty 1

## 2019-04-10 MED ORDER — METHYLPREDNISOLONE SODIUM SUCC 125 MG IJ SOLR
60.0000 mg | INTRAMUSCULAR | Status: DC
  Administered 2019-04-11: 60 mg via INTRAVENOUS
  Filled 2019-04-10: qty 2

## 2019-04-10 NOTE — TOC Initial Note (Signed)
Transition of Care [facility]) - Initial/Assessment Note    Patient Details  Name: Tony Garcia MRN: [id number] Date of Birth: Jul 30, 1945  Transition of [facility]) CM/SW Contact:    Claudie Leach, RN Phone Number: 04/10/2019, 11:32 AM  Clinical Narrative:                 Patient from home alone with family in the area to assist.  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  There is no other DME or O2 in the home.  Patient has never used home health. There are no barriers to health care- patient keeps MD appointments and obtains meds from pharmacy.     Expected Discharge Plan: Home/Self Care Barriers to Discharge: Continued Medical Work up    Expected Discharge Plan and Services Expected Discharge Plan: Home/Self Care       Living arrangements for the past 2 months: Single Family Home                   Prior Living Arrangements/Services Living arrangements for the past 2 months: Single Family Home Lives with:: Self, Pets Patient language and need for interpreter reviewed:: Yes        Need for Family Participation in Patient Care: Yes (Comment) Care giver support system in place?: Yes (comment)   Criminal Activity/Legal Involvement Pertinent to Current Situation/Hospitalization: No - Comment as needed   Admission diagnosis:  Community acquired pneumonia of right lower lobe of lung ([place]) [J18.1] Acute respiratory failure with hypoxia ([place]) [J96.01] Patient Active Problem List   Diagnosis Date Noted  . Bradycardia 04/09/2019  . Essential hypertension 04/09/2019  . Acute respiratory failure ([place]) 04/09/2019  . Cough with hemoptysis 04/09/2019  . Acute respiratory failure with hypoxia ([place]) 04/09/2019  . Myocardial ischemia   . Emphysema lung ([place])   . CAD (coronary artery disease)   . Dyslipidemia   . Chronic low back pain   . CAP (community acquired pneumonia) 04/08/2019   PCP:   [name], MD Pharmacy:   [facility], [place]. [place] [address] Phone: [phone] Fax: [phone]

## 2019-04-10 NOTE — Progress Notes (Signed)
PROGRESS NOTE    Tony Garcia  OEH:[id number] DOB: 04-20-1946 DOA: 04/08/2019 PCP: [name], MD   Brief Narrative: 73-year-old gentleman with prior history of coronary artery disease status post PCI, hypertension, hyperlipidemia, GERD, gout presents with shortness of breath associated with productive cough with blood-tinged sputum and fevers and chills for almost a week.  His chest x-ray showed right lung base opacification suspicious for pneumonia he was admitted for community-acquired pneumonia.  He was started on IV Rocephin and Zithromax.  Patient seen and examined today patient still continues to be short of breath and continues to require up to 2 L of nasal cannula oxygen to keep sats greater than 90%. He had one episode of blood-tinged sputum this morning.  We have requested him to save the sputum with blood for the RN and MD to take a look at it. Assessment & Plan:   Principal Problem:   Acute respiratory failure (HCC) Active Problems:   CAP (community acquired pneumonia)   Bradycardia   Essential hypertension   Emphysema lung (HCC)   CAD (coronary artery disease)   Dyslipidemia   Chronic low back pain   Cough with hemoptysis   Acute respiratory failure with hypoxia (HCC)   Acute respiratory failure with hypoxia secondary to community-acquired pneumonia Admitted for IV antibiotics started the patient on IV Rocephin and Zithromax.  Patient breathing has improved but he is not back to baseline yet and continues to have some shortness of breath cough with blood-tinged sputum and is requiring up to 2 L of nasal cannula oxygen to keep sats greater than 90%.  If his hemoptysis does not get better we will repeat CT of the chest for further evaluation. Send sputum for cultures. Urine for strep pneumonia is negative. Check ambulating oxygen levels in the next 24 hours. Since he is BNP slightly elevated we will get an echocardiogram to rule out CHF because of his history  of coronary artery disease and PCI. He is afebrile this morning and WBC count is around 11.9 Follow blood cultures that have been negative so far  Hyperlipidemia Continue with Zetia   Essential hypertension Blood pressure parameters suboptimal Restarted his home dose of lisinopril.   Bradycardia Resolved    History of coronary artery disease status post multiple PCI Patient currently denies any chest pain and his troponins are negative and EKG does not show any ischemic changes Continue with aspirin, Plavix, lisinopril, Zetia and patient was on Coreg which was held briefly for bradycardia.  Chronic low back pain Stable.  DVT prophylaxis: SCDs Code Status: Full code Family Communication: none at bedside.  Disposition Plan: pending clinical improvement.    Consultants:   None.    Procedures: echocardiogram.    Antimicrobials: rocephin and zithromax.  Since admission.   Subjective:   Patient seen and examined today patient still continues to be short of breath and continues to require up to 2 L of nasal cannula oxygen to keep sats greater than 90%. He had one episode of blood-tinged sputum this morning.  We have requested him to save the sputum with blood for the RN and MD to take a look at it. Patient continues to cough and is tachypneic on exertion. Patient denies any history of congestive heart failure. Patient denies any nausea vomiting or abdominal pain  Objective: Vitals:   04/09/19 2036 04/09/19 2250 04/09/19 2342 04/10/19 0809  BP: (!) 143/65 (!) 153/69 (!) 141/69 (!) 154/77  Pulse: (!) 55 (!) 58  (!) 56  Resp:    19  Temp: 98.1 F (36.7 C) (!) 97.5 F (36.4 C)  97.7 F (36.5 C)  TempSrc: Oral Oral  Oral  SpO2: 97% 95%  93%  Weight:      Height:        Intake/Output Summary (Last 24 hours) at 04/10/2019 1037 Last data filed at 04/10/2019 2993 Gross per 24 hour  Intake 550 ml  Output 400 ml  Net 150 ml   Filed Weights   04/09/19 1359   Weight: 97.7 kg    Examination:  General exam: mild distress from sob.  Respiratory system: rhonchi on the right, scattered rales. Tachypnea present.  Cardiovascular system: S1 & S2 heard, RRR.  No pedal edema. Gastrointestinal system: Abdomen is nondistended, soft and nontender. No organomegaly or masses felt. Normal bowel sounds heard. Central nervous system: Alert and oriented. No focal neurological deficits. Extremities: Symmetric 5 x 5 power. Skin: No rashes, lesions or ulcers Psychiatry:  Mood & affect appropriate.     Data Reviewed: I have personally reviewed following labs and imaging studies  CBC: Recent Labs  Lab 04/08/19 1436 04/10/19 0504  WBC 8.4 11.9*  HGB 13.1 12.3*  HCT 41.2 38.2*  MCV 101.0* 100.3*  PLT 426* 716*   Basic Metabolic Panel: Recent Labs  Lab 04/08/19 1436 04/10/19 0504  NA 138 137  K 4.1 4.6  CL 103 106  CO2 25 23  GLUCOSE 107* 172*  BUN 25* 23  CREATININE 1.13 0.86  CALCIUM 9.2 8.7*   GFR: Estimated Creatinine Clearance: 80.7 mL/min (by C-G formula based on SCr of 0.86 mg/dL). Liver Function Tests: No results for input(s): AST, ALT, ALKPHOS, BILITOT, PROT, ALBUMIN in the last 168 hours. No results for input(s): LIPASE, AMYLASE in the last 168 hours. No results for input(s): AMMONIA in the last 168 hours. Coagulation Profile: No results for input(s): INR, PROTIME in the last 168 hours. Cardiac Enzymes: No results for input(s): CKTOTAL, CKMB, CKMBINDEX, TROPONINI in the last 168 hours. BNP (last 3 results) No results for input(s): PROBNP in the last 8760 hours. HbA1C: No results for input(s): HGBA1C in the last 72 hours. CBG: No results for input(s): GLUCAP in the last 168 hours. Lipid Profile: No results for input(s): CHOL, HDL, LDLCALC, TRIG, CHOLHDL, LDLDIRECT in the last 72 hours. Thyroid Function Tests: Recent Labs    04/09/19 0324  TSH 5.512*  FREET4 1.03   Anemia Panel: No results for input(s): VITAMINB12,  FOLATE, FERRITIN, TIBC, IRON, RETICCTPCT in the last 72 hours. Sepsis Labs: No results for input(s): PROCALCITON, LATICACIDVEN in the last 168 hours.  Recent Results (from the past 240 hour(s))  Blood culture (routine x 2)     Status: None (Preliminary result)   Collection Time: 04/08/19 10:34 PM   Specimen: BLOOD  Result Value Ref Range Status   Specimen Description BLOOD LEFT ARM  Final   Special Requests   Final    BOTTLES DRAWN AEROBIC AND ANAEROBIC Blood Culture adequate volume   Culture   Final    NO GROWTH < 12 HOURS Performed at [facility], [address], [place], [place] [address]    Report Status PENDING  Incomplete  Blood culture (routine x 2)     Status: None (Preliminary result)   Collection Time: 04/08/19 10:40 PM   Specimen: BLOOD  Result Value Ref Range Status   Specimen Description BLOOD LEFT ARM  Final   Special Requests   Final    BOTTLES DRAWN  AEROBIC AND ANAEROBIC Blood Culture results may not be optimal due to an inadequate volume of blood received in culture bottles   Culture   Final    NO GROWTH < 12 HOURS Performed at [facility] [address], [place], [place] [address]    Report Status PENDING  Incomplete  SARS Coronavirus 2 [facility] order, Performed in [facility] hospital lab) Nasopharyngeal Nasopharyngeal Swab     Status: None   Collection Time: 04/08/19 11:30 PM   Specimen: Nasopharyngeal Swab  Result Value Ref Range Status   SARS Coronavirus 2 NEGATIVE NEGATIVE Final    Comment: (NOTE) If result is NEGATIVE SARS-CoV-2 target nucleic acids are NOT DETECTED. The SARS-CoV-2 RNA is generally detectable in upper and lower  respiratory specimens during the acute phase of infection. The lowest  concentration of SARS-CoV-2 viral copies this assay can detect is 250  copies / mL. A negative result does not preclude SARS-CoV-2 infection  and should not be used as the sole basis for treatment or other  patient management decisions.  A  negative result may occur with  improper specimen collection / handling, submission of specimen other  than nasopharyngeal swab, presence of viral mutation(s) within the  areas targeted by this assay, and inadequate number of viral copies  (<250 copies / mL). A negative result must be combined with clinical  observations, patient history, and epidemiological information. If result is POSITIVE SARS-CoV-2 target nucleic acids are DETECTED. The SARS-CoV-2 RNA is generally detectable in upper and lower  respiratory specimens dur ing the acute phase of infection.  Positive  results are indicative of active infection with SARS-CoV-2.  Clinical  correlation with patient history and other diagnostic information is  necessary to determine patient infection status.  Positive results do  not rule out bacterial infection or co-infection with other viruses. If result is PRESUMPTIVE POSTIVE SARS-CoV-2 nucleic acids MAY BE PRESENT.   A presumptive positive result was obtained on the submitted specimen  and confirmed on repeat testing.  While 2019 novel coronavirus  (SARS-CoV-2) nucleic acids may be present in the submitted sample  additional confirmatory testing may be necessary for epidemiological  and / or clinical management purposes  to differentiate between  SARS-CoV-2 and other Sarbecovirus currently known to infect humans.  If clinically indicated additional testing with an alternate test  methodology [phone]) is advised. The SARS-CoV-2 RNA is generally  detectable in upper and lower respiratory sp ecimens during the acute  phase of infection. The expected result is Negative. Fact Sheet for Patients:  StrictlyIdeas.no Fact Sheet for Healthcare Providers: BankingDealers.co.za This test is not yet approved or cleared by the [place] FDA and has been authorized for detection and/or diagnosis of SARS-CoV-2 by FDA under an Emergency Use  Authorization (EUA).  This EUA will remain in effect (meaning this test can be used) for the duration of the COVID-19 declaration under Section 564(b)(1) of the Act, 21 U.S.C. section 360bbb-3(b)(1), unless the authorization is terminated or revoked sooner. Performed at [facility], [place] [address] [place], [place] [address]          Radiology Studies: Dg Chest 2 View  Result Date: 04/08/2019 CLINICAL DATA:  Sob. Pt stated that he's been experiencing sob for 1 week w/ a cough. Pt said he started out producing a clear phlegm, then it went yellow-green, then brown, and this morning pt said it was bright red. EXAM: CHEST - 2 VIEW COMPARISON:  Chest radiograph 10/22/2006 FINDINGS: Mediastinal  contours are within normal limits. The heart size is enlarged. There is hazy opacification at the right lung base suspicious for infection. The left lung is clear. No pneumothorax or large pleural effusion. No acute finding in the visualized skeleton. IMPRESSION: 1. Hazy opacification at the right lung base suspicious for infection. 2. Cardiomegaly. Electronically Signed   By: Audie Pinto M.D.   On: 04/08/2019 14:36        Scheduled Meds:  aspirin EC  81 mg Oral Daily   clopidogrel  75 mg Oral Daily   dextromethorphan-guaiFENesin  1 tablet Oral BID   ezetimibe  5 mg Oral Daily   ipratropium-albuterol  3 mL Nebulization TID   lisinopril  20 mg Oral Daily   methylPREDNISolone (SOLU-MEDROL) injection  60 mg Intravenous Q12H   pantoprazole  40 mg Oral Daily   sodium chloride flush  3 mL Intravenous Once   Continuous Infusions:  azithromycin Stopped (04/09/19 2145)   cefTRIAXone (ROCEPHIN)  IV Stopped (04/10/19 0015)     LOS: 1 day        Hosie Poisson, MD [facility] Pager [phone]  If 7PM-7AM, please contact night-coverage [email or web address] Password [facility] 04/10/2019, 10:37 AM

## 2019-04-10 NOTE — Progress Notes (Signed)
  Echocardiogram 2D Echocardiogram has been performed.  Tony Garcia 04/10/2019, 11:57 AM

## 2019-04-11 MED ORDER — IPRATROPIUM-ALBUTEROL 0.5-2.5 (3) MG/3ML IN SOLN: 3.0000 mL | Freq: Four times a day (QID) | RESPIRATORY_TRACT | Status: DC | PRN

## 2019-04-11 MED ORDER — PREDNISONE 20 MG PO TABS
40.0000 mg | ORAL_TABLET | Freq: Every day | ORAL | Status: DC
  Administered 2019-04-12 – 2019-04-13 (×2): 40 mg via ORAL
  Filled 2019-04-11 (×2): qty 2

## 2019-04-11 MED ORDER — LISINOPRIL 40 MG PO TABS
40.0000 mg | ORAL_TABLET | Freq: Every day | ORAL | Status: DC
  Administered 2019-04-12 – 2019-04-13 (×2): 40 mg via ORAL
  Filled 2019-04-11 (×2): qty 1

## 2019-04-11 NOTE — Progress Notes (Signed)
PROGRESS NOTE    DEMAREA LOREY  ESP:[id number] DOB: May 14, 1946 DOA: 04/08/2019 PCP: [name], MD   Brief Narrative: 73-year-old gentleman with prior history of coronary artery disease status post PCI, hypertension, hyperlipidemia, GERD, gout presents with shortness of breath associated with productive cough with blood-tinged sputum and fevers and chills for almost a week.  His chest x-ray showed right lung base opacification suspicious for pneumonia he was admitted for community-acquired pneumonia.  He was started on IV Rocephin and Zithromax. Pt seen and examined at bedside.  He is on RA with good sats.  Very little hemoptysis today.   Assessment & Plan:   Principal Problem:   Acute respiratory failure (HCC) Active Problems:   CAP (community acquired pneumonia)   Bradycardia   Essential hypertension   Emphysema lung (HCC)   CAD (coronary artery disease)   Dyslipidemia   Chronic low back pain   Cough with hemoptysis   Acute respiratory failure with hypoxia (HCC)   Acute respiratory failure with hypoxia secondary to community-acquired pneumonia Admitted for IV antibiotics started the patient on IV Rocephin and Zithromax.  Patient breathing has improved , and he is on RA with sats greater than 90%.  But he is still tachypneic and he had little hemoptysis this morning.  If his hemoptysis does not get better we will repeat CT of the chest for further evaluation. Send sputum for cultures. Urine for strep pneumonia is negative. Since he is BNP slightly elevated we obtained an echocardiogram to rule out CHF because of his history of coronary artery disease and PCI. Echocardiogram showed preserved LVEF  No wall motion abnormalities.  He is afebrile this morning , repeat CBC in am. Sputum cultures ordered.   Follow blood cultures that have been negative so far  Hyperlipidemia Continue with Zetia   Essential hypertension Well controlled. Restarted his home meds.     Bradycardia Asymptomatic. No pauses on telemetry.     History of coronary artery disease status post multiple PCI Patient currently denies any chest pain and his troponins are negative and EKG does not show any ischemic changes Continue with aspirin, Plavix, lisinopril, Zetia and patient was on Coreg which was held briefly for bradycardia.  Chronic low back pain Stable.  DVT prophylaxis: SCDs Code Status: Full code Family Communication: none at bedside.  Disposition Plan: pending clinical improvement. Possible d.c in am, if he improves and no hemoptysis.    Consultants:   None.    Procedures: echocardiogram.    Antimicrobials: rocephin and zithromax.  Since admission.   Subjective: Pt seen and examined.  He reports having one episode of mild hemoptysis. Breathing has improved. And tapering steroids.    Objective: Vitals:   04/10/19 2235 04/11/19 0732 04/11/19 0813 04/11/19 1556  BP: 122/68  (!) 145/76 (!) 141/65  Pulse: (!) 54  (!) 49 (!) 52  Resp: 18  16 20   Temp: 97.8 F (36.6 C)  97.8 F (36.6 C) 97.8 F (36.6 C)  TempSrc: Oral  Oral Oral  SpO2: 96% 95% 95% 95%  Weight:      Height:       No intake or output data in the 24 hours ending 04/11/19 1612 Filed Weights   04/09/19 1359  Weight: 97.7 kg    Examination:  General exam: no distress noted.  Respiratory system: some tachypnea, air entry fair. Scattered wheezing on the right posterior.  Cardiovascular system: s1s2 heard, no pedal edema. RRR.  Gastrointestinal system: abd is soft , NT,  ND bs+ Central nervous system: alert and non focal.  Extremities: no pedal edema or cyanosis .  Skin: no rashes.  Psychiatry:  Mood appropriate.     Data Reviewed: I have personally reviewed following labs and imaging studies  CBC: Recent Labs  Lab 04/08/19 1436 04/10/19 0504  WBC 8.4 11.9*  HGB 13.1 12.3*  HCT 41.2 38.2*  MCV 101.0* 100.3*  PLT 426* 308*   Basic Metabolic Panel: Recent Labs  Lab  04/08/19 1436 04/10/19 0504  NA 138 137  K 4.1 4.6  CL 103 106  CO2 25 23  GLUCOSE 107* 172*  BUN 25* 23  CREATININE 1.13 0.86  CALCIUM 9.2 8.7*   GFR: Estimated Creatinine Clearance: 80.7 mL/min (by C-G formula based on SCr of 0.86 mg/dL). Liver Function Tests: No results for input(s): AST, ALT, ALKPHOS, BILITOT, PROT, ALBUMIN in the last 168 hours. No results for input(s): LIPASE, AMYLASE in the last 168 hours. No results for input(s): AMMONIA in the last 168 hours. Coagulation Profile: No results for input(s): INR, PROTIME in the last 168 hours. Cardiac Enzymes: No results for input(s): CKTOTAL, CKMB, CKMBINDEX, TROPONINI in the last 168 hours. BNP (last 3 results) No results for input(s): PROBNP in the last 8760 hours. HbA1C: No results for input(s): HGBA1C in the last 72 hours. CBG: No results for input(s): GLUCAP in the last 168 hours. Lipid Profile: No results for input(s): CHOL, HDL, LDLCALC, TRIG, CHOLHDL, LDLDIRECT in the last 72 hours. Thyroid Function Tests: Recent Labs    04/09/19 0324  TSH 5.512*  FREET4 1.03   Anemia Panel: No results for input(s): VITAMINB12, FOLATE, FERRITIN, TIBC, IRON, RETICCTPCT in the last 72 hours. Sepsis Labs: No results for input(s): PROCALCITON, LATICACIDVEN in the last 168 hours.  Recent Results (from the past 240 hour(s))  Blood culture (routine x 2)     Status: None (Preliminary result)   Collection Time: 04/08/19 10:34 PM   Specimen: BLOOD  Result Value Ref Range Status   Specimen Description BLOOD LEFT ARM  Final   Special Requests   Final    BOTTLES DRAWN AEROBIC AND ANAEROBIC Blood Culture adequate volume   Culture   Final    NO GROWTH 3 DAYS Performed at [facility], [address], [place], [place] [address]    Report Status PENDING  Incomplete  Blood culture (routine x 2)     Status: None (Preliminary result)   Collection Time: 04/08/19 10:40 PM   Specimen: BLOOD  Result Value Ref Range Status    Specimen Description BLOOD LEFT ARM  Final   Special Requests   Final    BOTTLES DRAWN AEROBIC AND ANAEROBIC Blood Culture results may not be optimal due to an inadequate volume of blood received in culture bottles   Culture   Final    NO GROWTH 3 DAYS Performed at [facility], [place] [address] [place], [place] [address]    Report Status PENDING  Incomplete  SARS Coronavirus 2 [facility] order, Performed in [facility] hospital lab) Nasopharyngeal Nasopharyngeal Swab     Status: None   Collection Time: 04/08/19 11:30 PM   Specimen: Nasopharyngeal Swab  Result Value Ref Range Status   SARS Coronavirus 2 NEGATIVE NEGATIVE Final    Comment: (NOTE) If result is NEGATIVE SARS-CoV-2 target nucleic acids are NOT DETECTED. The SARS-CoV-2 RNA is generally detectable in upper and lower  respiratory specimens during the acute phase of infection. The lowest  concentration of SARS-CoV-2 viral  copies this assay can detect is 250  copies / mL. A negative result does not preclude SARS-CoV-2 infection  and should not be used as the sole basis for treatment or other  patient management decisions.  A negative result may occur with  improper specimen collection / handling, submission of specimen other  than nasopharyngeal swab, presence of viral mutation(s) within the  areas targeted by this assay, and inadequate number of viral copies  (<250 copies / mL). A negative result must be combined with clinical  observations, patient history, and epidemiological information. If result is POSITIVE SARS-CoV-2 target nucleic acids are DETECTED. The SARS-CoV-2 RNA is generally detectable in upper and lower  respiratory specimens dur ing the acute phase of infection.  Positive  results are indicative of active infection with SARS-CoV-2.  Clinical  correlation with patient history and other diagnostic information is  necessary to determine patient infection status.  Positive results do  not rule out bacterial  infection or co-infection with other viruses. If result is PRESUMPTIVE POSTIVE SARS-CoV-2 nucleic acids MAY BE PRESENT.   A presumptive positive result was obtained on the submitted specimen  and confirmed on repeat testing.  While 2019 novel coronavirus  (SARS-CoV-2) nucleic acids may be present in the submitted sample  additional confirmatory testing may be necessary for epidemiological  and / or clinical management purposes  to differentiate between  SARS-CoV-2 and other Sarbecovirus currently known to infect humans.  If clinically indicated additional testing with an alternate test  methodology [phone]) is advised. The SARS-CoV-2 RNA is generally  detectable in upper and lower respiratory sp ecimens during the acute  phase of infection. The expected result is Negative. Fact Sheet for Patients:  StrictlyIdeas.no Fact Sheet for Healthcare Providers: BankingDealers.co.za This test is not yet approved or cleared by the [place] FDA and has been authorized for detection and/or diagnosis of SARS-CoV-2 by FDA under an Emergency Use Authorization (EUA).  This EUA will remain in effect (meaning this test can be used) for the duration of the COVID-19 declaration under Section 564(b)(1) of the Act, 21 U.S.C. section 360bbb-3(b)(1), unless the authorization is terminated or revoked sooner. Performed at [facility], [place] [address], [place], [place] [address]          Radiology Studies: No results found.      Scheduled Meds: . aspirin EC  81 mg Oral Daily  . clopidogrel  75 mg Oral Daily  . dextromethorphan-guaiFENesin  1 tablet Oral BID  . ezetimibe  5 mg Oral Daily  . lisinopril  20 mg Oral Daily  . methylPREDNISolone (SOLU-MEDROL) injection  60 mg Intravenous Q24H  . pantoprazole  40 mg Oral Daily  . sodium chloride flush  3 mL Intravenous Once   Continuous Infusions: . azithromycin 500 mg (04/10/19 2207)  .  cefTRIAXone (ROCEPHIN)  IV 1 g (04/10/19 2124)     LOS: 2 days        Hosie Poisson, MD [facility] Pager [phone]  If 7PM-7AM, please contact night-coverage [email or web address] Password [email or web address] 04/11/2019, 4:12 PM

## 2019-04-11 NOTE — Progress Notes (Signed)
SATURATION QUALIFICATIONS: (This note is used to comply with regulatory documentation for home oxygen)  Patient Saturations on Room Air at Rest = 95%  Patient Saturations on Room Air while Ambulating = 94%  Patient Saturations on 2 Liters of oxygen while Ambulating = 98%  Patient does not qualify for home O2.

## 2019-04-12 ENCOUNTER — Inpatient Hospital Stay (HOSPITAL_COMMUNITY): Payer: Medicare PPO

## 2019-04-12 LAB — BASIC METABOLIC PANEL
Anion gap: 9 (ref 5–15)
BUN: 24 mg/dL — ABNORMAL HIGH (ref 8–23)
CO2: 24 mmol/L (ref 22–32)
Calcium: 8.4 mg/dL — ABNORMAL LOW (ref 8.9–10.3)
Chloride: 107 mmol/L (ref 98–111)
Creatinine, Ser: 0.88 mg/dL (ref 0.61–1.24)
GFR calc Af Amer: >60 mL/min (ref >60)
GFR calc non Af Amer: >60 mL/min (ref >60)
Glucose, Bld: 82 mg/dL (ref 70–99)
Potassium: 3.8 mmol/L (ref 3.5–5.1)
Sodium: 140 mmol/L (ref 135–145)

## 2019-04-12 LAB — CBC
HCT: 37.9 % — ABNORMAL LOW (ref 39.0–52.0)
Hemoglobin: 12.4 g/dL — ABNORMAL LOW (ref 13.0–17.0)
MCH: 32.3 pg (ref 26.0–34.0)
MCHC: 32.7 g/dL (ref 30.0–36.0)
MCV: 98.7 fL (ref 80.0–100.0)
Platelets: 538 10*3/uL — ABNORMAL HIGH (ref 150–400)
RBC: 3.84 MIL/uL — ABNORMAL LOW (ref 4.22–5.81)
RDW: 13.6 % (ref 11.5–15.5)
WBC: 10.9 10*3/uL — ABNORMAL HIGH (ref 4.0–10.5)
nRBC: 0 % (ref 0.0–0.2)

## 2019-04-12 LAB — EXPECTORATED SPUTUM ASSESSMENT W GRAM STAIN, RFLX TO RESP C

## 2019-04-12 MED ORDER — PREDNISONE 20 MG PO TABS: ORAL_TABLET | ORAL | 0 refills | Status: DC

## 2019-04-12 MED ORDER — AZITHROMYCIN 250 MG PO TABS
500.0000 mg | ORAL_TABLET | ORAL | Status: DC
  Administered 2019-04-12: 500 mg via ORAL
  Filled 2019-04-12: qty 2

## 2019-04-12 NOTE — Progress Notes (Signed)
This patient is receiving the antibiotic(s) Azithromycin by the intravenous route. Based on criteria approved by the Pharmacy and Therapeutics Committee, and the Infectious Disease Division, the antibiotic(s) is / are being converted to equivalent oral dose form(s). These criteria include: . Patient being treated for a respiratory tract infection, urinary tract infection, cellulitis, or Clostridium Difficile Associated Diarrhea . The patient is not neutropenic and does not exhibit a GI malabsorption state . The patient is eating (either orally or per tube) and/or has been taking other orally administered medications for at least 24 hours. . The patient is improving clinically (physician assessment and a 24-hour Tmax of 100.5F).  If you have questions about this conversion, please contact the pharmacy department. Thank you.  Britney Captain, PharmD PGY1 Pharmacy Resident Office phone: [phone] Phone until 3:30 pm: [phone] 04/12/2019  

## 2019-04-12 NOTE — Progress Notes (Signed)
PROGRESS NOTE    Tony Garcia  VWU:[id number] DOB: 1946/05/06 DOA: 04/08/2019 PCP: [name], MD   Brief Narrative: 73-year-old gentleman with prior history of coronary artery disease status post PCI, hypertension, hyperlipidemia, GERD, gout presents with shortness of breath associated with productive cough with blood-tinged sputum and fevers and chills for almost a week.  His chest x-ray showed right lung base opacification suspicious for pneumonia he was admitted for community-acquired pneumonia.  He was started on IV Rocephin and Zithromax.  Patient seen and examined at bedside Patient showed a cup of sputum mixed with blood.  Repeat CXR does not show any improvement in the pneumonia. We will obtain a CT chest without contrast for further evaluation  Assessment & Plan:   Principal Problem:   Acute respiratory failure ([place]) Active Problems:   CAP (community acquired pneumonia)   Bradycardia   Essential hypertension   Emphysema lung ([facility])   CAD (coronary artery disease)   Dyslipidemia   Chronic low back pain   Cough with hemoptysis   Acute respiratory failure with hypoxia ([place])   Acute respiratory failure with hypoxia secondary to community-acquired pneumonia Admitted for IV antibiotics started the patient on IV Rocephin and Zithromax.  Patient breathing has improved , and he is on RA with sats around 90% His tachypnea has improved but he continues to cough and had hemoptysis this morning.  Repeat chest x-ray does not show any improvement in the pneumonia hence CT chest without contrast ordered for further evaluation. Send sputum sent for cultures Urine for strep pneumonia is negative. Since he is BNP slightly elevated we obtained an echocardiogram to rule out CHF because of his history of coronary artery disease and PCI. Echocardiogram showed preserved LVEF  No wall motion abnormalities.  Blood cultures have been negative so far.  He continues to be afebrile but  due to his continued mild hemoptysis with persistent cough and tachypnea will need further evaluation with a CT chest and is not safe for discharge.  Hyperlipidemia Continue with Zetia   Essential hypertension Well controlled. Restarted his home meds.    Bradycardia Asymptomatic.  Continue to hold Coreg    History of coronary artery disease status post multiple PCI Patient currently denies any chest pain and his troponins are negative and EKG does not show any ischemic changes Continue with aspirin, Plavix, lisinopril, Zetia and patient was on Coreg which was held briefly for bradycardia. No new changes in medications  Chronic low back pain Stable.  DVT prophylaxis: SCDs Code Status: Full code Family Communication: none at bedside.  Disposition Plan: Pending CAT scan results and improvement in the pneumonia   Consultants:   None.    Procedures: echocardiogram.    Antimicrobials: rocephin and zithromax.  Since admission.   Subjective: Patient seen and examined at bedside, patient continues to have some cough with repeat of hemoptysis repeat chest x-ray does not show any improvement in pneumonia ordered CT chest without contrast.  He is afebrile but continues to be tachypneic.  Objective: Vitals:   04/11/19 0813 04/11/19 1556 04/11/19 2316 04/12/19 0727  BP: (!) 145/76 (!) 141/65 134/60 (!) 149/70  Pulse: (!) 49 (!) 52 (!) 55 (!) 48  Resp: 16 20 20 16   Temp: 97.8 F (36.6 C) 97.8 F (36.6 C) 98.3 F (36.8 C) 97.8 F (36.6 C)  TempSrc: Oral Oral Oral Oral  SpO2: 95% 95% 95%   Weight:      Height:        Intake/Output  Summary (Last 24 hours) at 04/12/2019 1545 Last data filed at 04/12/2019 1941 Gross per 24 hour  Intake 1178 ml  Output 2 ml  Net 1176 ml   Filed Weights   04/09/19 1359  Weight: 97.7 kg    Examination:  General exam: Alert and comfortable Respiratory system: Coughing, tachypneic,  wheezing has improved, diminished air entry at  bases.   Cardiovascular system: S1-S2 heard, no pedal edema.  Bradycardic Gastrointestinal system: Abdomen is soft, nontender, nondistended, bowel sounds are good Central nervous system: Alert and oriented and nonfocal Extremities: No pedal edema or cyanosis Skin: No ulcers or rashes Psychiatry: Mood is appropriate    Data Reviewed: I have personally reviewed following labs and imaging studies  CBC: Recent Labs  Lab 04/08/19 1436 04/10/19 0504 04/12/19 0421  WBC 8.4 11.9* 10.9*  HGB 13.1 12.3* 12.4*  HCT 41.2 38.2* 37.9*  MCV 101.0* 100.3* 98.7  PLT 426* 458* 740*   Basic Metabolic Panel: Recent Labs  Lab 04/08/19 1436 04/10/19 0504 04/12/19 0421  NA 138 137 140  K 4.1 4.6 3.8  CL 103 106 107  CO2 25 23 24   GLUCOSE 107* 172* 82  BUN 25* 23 24*  CREATININE 1.13 0.86 0.88  CALCIUM 9.2 8.7* 8.4*   GFR: Estimated Creatinine Clearance: 78.9 mL/min (by C-G formula based on SCr of 0.88 mg/dL). Liver Function Tests: No results for input(s): AST, ALT, ALKPHOS, BILITOT, PROT, ALBUMIN in the last 168 hours. No results for input(s): LIPASE, AMYLASE in the last 168 hours. No results for input(s): AMMONIA in the last 168 hours. Coagulation Profile: No results for input(s): INR, PROTIME in the last 168 hours. Cardiac Enzymes: No results for input(s): CKTOTAL, CKMB, CKMBINDEX, TROPONINI in the last 168 hours. BNP (last 3 results) No results for input(s): PROBNP in the last 8760 hours. HbA1C: No results for input(s): HGBA1C in the last 72 hours. CBG: No results for input(s): GLUCAP in the last 168 hours. Lipid Profile: No results for input(s): CHOL, HDL, LDLCALC, TRIG, CHOLHDL, LDLDIRECT in the last 72 hours. Thyroid Function Tests: No results for input(s): TSH, T4TOTAL, FREET4, T3FREE, THYROIDAB in the last 72 hours. Anemia Panel: No results for input(s): VITAMINB12, FOLATE, FERRITIN, TIBC, IRON, RETICCTPCT in the last 72 hours. Sepsis Labs: No results for input(s):  PROCALCITON, LATICACIDVEN in the last 168 hours.  Recent Results (from the past 240 hour(s))  Blood culture (routine x 2)     Status: None (Preliminary result)   Collection Time: 04/08/19 10:34 PM   Specimen: BLOOD  Result Value Ref Range Status   Specimen Description BLOOD LEFT ARM  Final   Special Requests   Final    BOTTLES DRAWN AEROBIC AND ANAEROBIC Blood Culture adequate volume   Culture   Final    NO GROWTH 4 DAYS Performed at [facility], [address], [place], [place] [address]    Report Status PENDING  Incomplete  Blood culture (routine x 2)     Status: None (Preliminary result)   Collection Time: 04/08/19 10:40 PM   Specimen: BLOOD  Result Value Ref Range Status   Specimen Description BLOOD LEFT ARM  Final   Special Requests   Final    BOTTLES DRAWN AEROBIC AND ANAEROBIC Blood Culture results may not be optimal due to an inadequate volume of blood received in culture bottles   Culture   Final    NO GROWTH 4 DAYS Performed at [facility], [place] [address] [place], [place]  [place]    Report Status PENDING  Incomplete  SARS Coronavirus 2 [facility] order, Performed in [facility] hospital lab) Nasopharyngeal Nasopharyngeal Swab     Status: None   Collection Time: 04/08/19 11:30 PM   Specimen: Nasopharyngeal Swab  Result Value Ref Range Status   SARS Coronavirus 2 NEGATIVE NEGATIVE Final    Comment: (NOTE) If result is NEGATIVE SARS-CoV-2 target nucleic acids are NOT DETECTED. The SARS-CoV-2 RNA is generally detectable in upper and lower  respiratory specimens during the acute phase of infection. The lowest  concentration of SARS-CoV-2 viral copies this assay can detect is 250  copies / mL. A negative result does not preclude SARS-CoV-2 infection  and should not be used as the sole basis for treatment or other  patient management decisions.  A negative result may occur with  improper specimen collection / handling, submission of specimen other  than  nasopharyngeal swab, presence of viral mutation(s) within the  areas targeted by this assay, and inadequate number of viral copies  (<250 copies / mL). A negative result must be combined with clinical  observations, patient history, and epidemiological information. If result is POSITIVE SARS-CoV-2 target nucleic acids are DETECTED. The SARS-CoV-2 RNA is generally detectable in upper and lower  respiratory specimens dur ing the acute phase of infection.  Positive  results are indicative of active infection with SARS-CoV-2.  Clinical  correlation with patient history and other diagnostic information is  necessary to determine patient infection status.  Positive results do  not rule out bacterial infection or co-infection with other viruses. If result is PRESUMPTIVE POSTIVE SARS-CoV-2 nucleic acids MAY BE PRESENT.   A presumptive positive result was obtained on the submitted specimen  and confirmed on repeat testing.  While 2019 novel coronavirus  (SARS-CoV-2) nucleic acids may be present in the submitted sample  additional confirmatory testing may be necessary for epidemiological  and / or clinical management purposes  to differentiate between  SARS-CoV-2 and other Sarbecovirus currently known to infect humans.  If clinically indicated additional testing with an alternate test  methodology [phone]) is advised. The SARS-CoV-2 RNA is generally  detectable in upper and lower respiratory sp ecimens during the acute  phase of infection. The expected result is Negative. Fact Sheet for Patients:  StrictlyIdeas.no Fact Sheet for Healthcare Providers: BankingDealers.co.za This test is not yet approved or cleared by the [place] FDA and has been authorized for detection and/or diagnosis of SARS-CoV-2 by FDA under an Emergency Use Authorization (EUA).  This EUA will remain in effect (meaning this test can be used) for the duration of the  COVID-19 declaration under Section 564(b)(1) of the Act, 21 U.S.C. section 360bbb-3(b)(1), unless the authorization is terminated or revoked sooner. Performed at [facility], [place] [address], [place], [place] [address]   Expectorated sputum assessment w rflx to resp cult     Status: None   Collection Time: 04/12/19 11:41 AM   Specimen: Sputum  Result Value Ref Range Status   Specimen Description SPU  Final   Special Requests NONE  Final   Sputum evaluation   Final    THIS SPECIMEN IS ACCEPTABLE FOR SPUTUM CULTURE Performed at [facility], [place] [address] [place], [place] [address]    Report Status 04/12/2019 FINAL  Final  Culture, respiratory     Status: None (Preliminary result)   Collection Time: 04/12/19 11:41 AM   Specimen: Sputum  Result Value Ref Range Status   Specimen Description SPU  Final   Special Requests NONE Reflexed from [id number]  Final   Gram Stain   Final    RARE WBC PRESENT, PREDOMINANTLY PMN FEW GRAM POSITIVE COCCI FEW YEAST RARE GRAM NEGATIVE RODS Performed at [facility], [place] [address] [place], [place] [address]    Culture PENDING  Incomplete   Report Status PENDING  Incomplete         Radiology Studies: Dg Chest 2 View  Result Date: 04/12/2019 CLINICAL DATA:  Follow-up pneumonia.  Acute respiratory failure. EXAM: CHEST - 2 VIEW COMPARISON:  April 08, 2019 FINDINGS: Persistent infiltrate in the right lower lung is stable. Calcified granuloma is seen in the right apex. The left lung is clear. The cardiomediastinal silhouette is stable. No pneumothorax. IMPRESSION: Persistent infiltrate in the right lower lobe worrisome for pneumonia. Recommend short-term follow-up to ensure resolution. Electronically Signed   By: Dorise Bullion III M.D   On: 04/12/2019 15:02        Scheduled Meds: . aspirin EC  81 mg Oral Daily  . azithromycin  500 mg Oral Q24H  . clopidogrel  75 mg Oral Daily  . dextromethorphan-guaiFENesin  1 tablet  Oral BID  . ezetimibe  5 mg Oral Daily  . lisinopril  40 mg Oral Daily  . pantoprazole  40 mg Oral Daily  . predniSONE  40 mg Oral QAC breakfast  . sodium chloride flush  3 mL Intravenous Once   Continuous Infusions: . cefTRIAXone (ROCEPHIN)  IV 1 g (04/11/19 2149)     LOS: 3 days        Hosie Poisson, MD [facility] Pager [phone]  If 7PM-7AM, please contact night-coverage [email or web address] Password [facility] 04/12/2019, 3:45 PM

## 2019-04-13 ENCOUNTER — Inpatient Hospital Stay (HOSPITAL_COMMUNITY): Payer: Medicare PPO

## 2019-04-13 DIAGNOSIS — E785 Hyperlipidemia, unspecified: Secondary | ICD-10-CM

## 2019-04-13 LAB — CULTURE, BLOOD (ROUTINE X 2)
Culture: NO GROWTH
Culture: NO GROWTH
Special Requests: ADEQUATE

## 2019-04-13 MED ORDER — LEVOFLOXACIN 500 MG PO TABS: 500.0000 mg | ORAL_TABLET | Freq: Every day | ORAL | 0 refills | Status: AC

## 2019-04-13 MED ORDER — PREDNISONE 20 MG PO TABS: ORAL_TABLET | ORAL | 0 refills | Status: DC

## 2019-04-13 MED ORDER — PANTOPRAZOLE SODIUM 40 MG PO TBEC: 40.0000 mg | DELAYED_RELEASE_TABLET | Freq: Two times a day (BID) | ORAL | 0 refills | Status: AC

## 2019-04-13 MED ORDER — PANTOPRAZOLE SODIUM 40 MG PO TBEC: 40.0000 mg | DELAYED_RELEASE_TABLET | Freq: Two times a day (BID) | ORAL | Status: DC

## 2019-04-13 MED ORDER — LEVOFLOXACIN 500 MG PO TABS: 500.0000 mg | ORAL_TABLET | Freq: Every day | ORAL | 0 refills | Status: DC

## 2019-04-13 MED ORDER — PANTOPRAZOLE SODIUM 40 MG PO TBEC: 40.0000 mg | DELAYED_RELEASE_TABLET | Freq: Two times a day (BID) | ORAL | 0 refills | Status: DC

## 2019-04-13 NOTE — Progress Notes (Signed)
Patient discharged from facility. Family notified by patient about discharged. MD Karleen Hampshire gave the OK to dc patient. Patient reports no further questions about his discharge and remains alert and oriented. Pt requested to be wheeled outside to wait for family there instead of room.

## 2019-04-13 NOTE — Care Management Important Message (Signed)
Important Message  Patient Details  Name: Tony Garcia MRN: [id number] Date of Birth: 06-Nov-1945   Medicare Important Message Given:  Yes     Orbie Pyo 04/13/2019, 2:31 PM

## 2019-04-14 LAB — CULTURE, RESPIRATORY W GRAM STAIN: Culture: NORMAL

## 2019-04-14 NOTE — Discharge Summary (Signed)
Physician Discharge Summary  Tony Garcia:[id number] DOB: 09-Mar-1946 DOA: 04/08/2019  PCP: [name], MD  Admit date: 04/08/2019 Discharge date: 04/13/2019  Admitted From: Home.  Disposition:  Home.   Recommendations for Outpatient Follow-up:  1. Follow up with PCP in 1-2 weeks 2. Please obtain BMP/CBC in one week 3. There was a 8 mm pulmonary nodule in the left lower lobe. Non-contrast chest CT at 6-12 months is recommended for further evaluation.  4. Recommend follow up with gastroenterologist for esophagitis, for EGD.  5. We have held your coreg for bradycardia for HR in 40's to 50's. Please follow up with your cardiology on discharge in 1 to 2 weeks.    Discharge Condition:stable. CODE STATUS:full code.  Diet recommendation: Heart Healthy   Brief/Interim Summary: 73-year-old gentleman with prior history of coronary artery disease status post PCI, hypertension, hyperlipidemia, GERD, gout presents with shortness of breath associated with productive cough with blood-tinged sputum and fevers and chills for almost a week.  His chest x-ray showed right lung base opacification suspicious for pneumonia he was admitted for community-acquired pneumonia.  He was started on IV Rocephin and Zithromax.  Repeat CXR does not show any improvement in the pneumonia. We will obtain a CT chest without contrast for further evaluation, it shows right lower lobe pneumonia and esophagitis, increased the dose of protonix to BID and asked him to follow upw ith gastroenterologist for [place].   Discharge Diagnoses:  Principal Problem:   Acute respiratory failure ([place]) Active Problems:   CAP (community acquired pneumonia)   Bradycardia   Essential hypertension   Emphysema lung (HCC)   CAD (coronary artery disease)   Dyslipidemia   Chronic low back pain   Cough with hemoptysis   Acute respiratory failure with hypoxia (HCC)  Acute respiratory failure with hypoxia secondary to  community-acquired pneumonia Admitted for IV antibiotics started the patient on IV Rocephin and Zithromax.  Patient breathing has improved , and he is on RA with sats around 90% His tachypnea has improved but he continues to cough and had hemoptysis this morning.  Repeat chest x-ray does not show any improvement in the pneumonia hence CT chest without contrast ordered for further evaluation. Send sputum sent for cultures and are pending.  Urine for strep pneumonia is negative. Since he is BNP slightly elevated we obtained an echocardiogram to rule out CHF because of his history of coronary artery disease and PCI. Echocardiogram showed preserved LVEF  No wall motion abnormalities.  Blood cultures have been negative so far.  CT chest without contrast ordered and showed right lower lobe pneumonia. Discharged on oral levaquin to complete the course.   Hyperlipidemia Continue with Zetia   Essential hypertension Well controlled. Restarted his home meds.    Bradycardia Asymptomatic.  Continue to hold Coreg    History of coronary artery disease status post multiple PCI Patient currently denies any chest pain and his troponins are negative and EKG does not show any ischemic changes Continue with aspirin, Plavix, lisinopril, Zetia and patient was on Coreg which was held  for bradycardia. No new changes in medications  Chronic low back pain Stable.  Discharge Instructions  Discharge Instructions    Diet - low sodium heart healthy   Complete by: As directed    Discharge instructions   Complete by: As directed    Please get repeat CXR in 2 to 4 weeks to evaluate for resolution of the pneumonia.  We have discontinued the coreg for bradycardia. Please talk  to your cardiologist regarding resuming the medication.     Allergies as of 04/13/2019      Reactions   Penicillins Shortness Of Breath   Did it involve swelling of the face/tongue/throat, SOB, or low BP? Yes Did it involve  sudden or severe rash/hives, skin peeling, or any reaction on the inside of your mouth or nose? No Did you need to seek medical attention at a hospital or doctor's office? No When did it last happen?<10 yrs If all above answers are "NO", may proceed with cephalosporin use.      Medication List    STOP taking these medications   carvedilol 12.5 MG tablet Commonly known as: COREG   doxycycline 100 MG capsule Commonly known as: MONODOX   omeprazole 20 MG capsule Commonly known as: PRILOSEC     TAKE these medications   acetaminophen 325 MG tablet Commonly known as: TYLENOL Take 325-650 mg by mouth every 6 (six) hours as needed for mild pain.   albuterol (2.5 MG/3ML) 0.083% nebulizer solution Commonly known as: PROVENTIL Take 2.5 mg by nebulization every 6 (six) hours as needed for wheezing or shortness of breath.   albuterol 108 (90 Base) MCG/ACT inhaler Commonly known as: VENTOLIN HFA Inhale 2 puffs into the lungs every 6 (six) hours as needed for wheezing or shortness of breath.   aspirin EC 81 MG tablet Take 81 mg by mouth daily.   cetirizine 10 MG tablet Commonly known as: ZYRTEC Take 10 mg by mouth at bedtime.   clopidogrel 75 MG tablet Commonly known as: PLAVIX Take 75 mg by mouth daily. Notes to patient: 04/14/19 AM   co-enzyme Q-10 30 MG capsule Take 30 mg by mouth 2 (two) times daily.   ezetimibe 10 MG tablet Commonly known as: ZETIA Take 5 mg by mouth daily. Notes to patient: 04/14/2019 AM   Fish Oil 1000 MG Caps Take 2,000 mg by mouth 2 (two) times daily.   fluticasone 50 MCG/ACT nasal spray Commonly known as: FLONASE Place 1-2 sprays into both nostrils daily as needed for allergies or rhinitis.   guaifenesin 400 MG Tabs tablet Commonly known as: HUMIBID E Take 400 mg by mouth every 4 (four) hours as needed (congestion).   hydroxypropyl methylcellulose / hypromellose 2.5 % ophthalmic solution Commonly known as: ISOPTO TEARS / GONIOVISC Place  1 drop into both eyes 3 (three) times daily as needed for dry eyes.   levofloxacin 500 MG tablet Commonly known as: Levaquin Take 1 tablet (500 mg total) by mouth daily for 3 days.   lisinopril 40 MG tablet Commonly known as: ZESTRIL Take 40 mg by mouth daily. Notes to patient: 04/14/2019 AM   lovastatin 40 MG tablet Commonly known as: MEVACOR Take 40 mg by mouth at bedtime.   nitroGLYCERIN 0.4 MG SL tablet Commonly known as: NITROSTAT Place 0.4 mg under the tongue every 5 (five) minutes as needed for chest pain.   pantoprazole 40 MG tablet Commonly known as: PROTONIX Take 1 tablet (40 mg total) by mouth 2 (two) times daily. Notes to patient: 04/13/2019 PM   predniSONE 20 MG tablet Commonly known as: DELTASONE Prednisone 40 mg daily for 2 days followed by  Prednisone 20 mg daily for 3 days. Notes to patient: 04/14/2019 AM    sodium chloride 0.65 % Soln nasal spray Commonly known as: OCEAN Place 2 sprays into both nostrils as needed for congestion.      Follow-up Information    [name], MD. Schedule an appointment as  soon as possible for a visit in 2 weeks.   Specialty: Internal Medicine Why: Appointment already scheduled for May 04, 2019 at 9:00 a.m. Contact information: [place] [address] [phone]          Allergies  Allergen Reactions  . Penicillins Shortness Of Breath    Did it involve swelling of the face/tongue/throat, SOB, or low BP? Yes Did it involve sudden or severe rash/hives, skin peeling, or any reaction on the inside of your mouth or nose? No Did you need to seek medical attention at a hospital or doctor's office? No When did it last happen?<10 yrs If all above answers are "NO", may proceed with cephalosporin use.     Consultations:  None.    Procedures/Studies: Dg Chest 2 View  Result Date: 04/12/2019 CLINICAL DATA:  Follow-up pneumonia.  Acute respiratory failure. EXAM: CHEST - 2 VIEW  COMPARISON:  April 08, 2019 FINDINGS: Persistent infiltrate in the right lower lung is stable. Calcified granuloma is seen in the right apex. The left lung is clear. The cardiomediastinal silhouette is stable. No pneumothorax. IMPRESSION: Persistent infiltrate in the right lower lobe worrisome for pneumonia. Recommend short-term follow-up to ensure resolution. Electronically Signed   By: Dorise Bullion III M.D   On: 04/12/2019 15:02   Dg Chest 2 View  Result Date: 04/08/2019 CLINICAL DATA:  Sob. Pt stated that he's been experiencing sob for 1 week w/ a cough. Pt said he started out producing a clear phlegm, then it went yellow-green, then brown, and this morning pt said it was bright red. EXAM: CHEST - 2 VIEW COMPARISON:  Chest radiograph 10/22/2006 FINDINGS: Mediastinal contours are within normal limits. The heart size is enlarged. There is hazy opacification at the right lung base suspicious for infection. The left lung is clear. No pneumothorax or large pleural effusion. No acute finding in the visualized skeleton. IMPRESSION: 1. Hazy opacification at the right lung base suspicious for infection. 2. Cardiomegaly. Electronically Signed   By: Audie Pinto M.D.   On: 04/08/2019 14:36   Ct Chest Wo Contrast  Result Date: 04/13/2019 CLINICAL DATA:  Pneumonia EXAM: CT CHEST WITHOUT CONTRAST TECHNIQUE: Multidetector CT imaging of the chest was performed following the standard protocol without IV contrast. COMPARISON:  None. FINDINGS: Cardiovascular: Heart size is normal. Atherosclerotic changes are noted of the thoracic aorta. There are atherosclerotic changes of the coronary arteries. The main pulmonary artery is mildly dilated measuring approximately 3.4 cm in diameter. There is no significant pericardial effusion. Mediastinum/Nodes: --there are calcified hilar and mediastinal lymph nodes, likely related to prior granulomatous disease. --No axillary lymphadenopathy. --No supraclavicular  lymphadenopathy. --Normal thyroid gland. --there is some mild diffuse wall thickening of the distal esophagus. Lungs/Pleura: Mild emphysematous changes are noted. There is pleuroparenchymal scarring at the lung apices. A calcified granuloma is noted in the right upper lobe. There is consolidation involving the right lower lobe. There is an 8 mm pulmonary nodule in the left lower lobe (axial series 5, image 128). There is no pneumothorax. The trachea is unremarkable. There is no significant pleural effusion. Upper Abdomen: The patient is status post prior cholecystectomy. Musculoskeletal: No chest wall abnormality. No acute or significant osseous findings. IMPRESSION: 1. Right lower lobe pneumonia. 2. 8 mm pulmonary nodule in the left lower lobe. Non-contrast chest CT at 6-12 months is recommended. If the nodule is stable at time of repeat CT, then future CT at 18-24 months (from today's scan) is considered optional for low-risk  patients, but is recommended for high-risk patients. This recommendation follows the consensus statement: Guidelines for Management of Incidental Pulmonary Nodules Detected on CT Images: From the Fleischner Society 2017; Radiology 2017; 284:228-243. 3. Mildly dilated main pulmonary artery which can be seen in patients with elevated pulmonary artery pressures. 4. Mild diffuse wall thickening of the distal esophagus which is nonspecific but can be seen in patients with esophagitis. Aortic Atherosclerosis (ICD10-I70.0) and Emphysema (ICD10-J43.9). Electronically Signed   By: Constance Holster M.D.   On: 04/13/2019 07:49       Subjective: No new complaints. Cough improved, hemoptysis resolved.   Discharge Exam: Vitals:   04/12/19 2314 04/13/19 0745  BP: (!) 149/65 (!) 162/77  Pulse: (!) 50 (!) 49  Resp: 18   Temp: 98.2 F (36.8 C) 98.5 F (36.9 C)  SpO2: 96% 93%   Vitals:   04/12/19 0727 04/12/19 1628 04/12/19 2314 04/13/19 0745  BP: (!) 149/70 (!) 150/74 (!) 149/65 (!)  162/77  Pulse: (!) 48 (!) 52 (!) 50 (!) 49  Resp: 16 16 18    Temp: 97.8 F (36.6 C) 98.2 F (36.8 C) 98.2 F (36.8 C) 98.5 F (36.9 C)  TempSrc: Oral Oral Oral Oral  SpO2:  95% 96% 93%  Weight:      Height:        General: Pt is alert, awake, not in acute distress Cardiovascular: RRR, S1/S2 +, no rubs, no gallops Respiratory: CTA bilaterally, no wheezing, no rhonchi Abdominal: Soft, NT, ND, bowel sounds + Extremities: no edema, no cyanosis    The results of significant diagnostics from this hospitalization (including imaging, microbiology, ancillary and laboratory) are listed below for reference.     Microbiology: Recent Results (from the past 240 hour(s))  Blood culture (routine x 2)     Status: None   Collection Time: 04/08/19 10:34 PM   Specimen: BLOOD  Result Value Ref Range Status   Specimen Description BLOOD LEFT ARM  Final   Special Requests   Final    BOTTLES DRAWN AEROBIC AND ANAEROBIC Blood Culture adequate volume   Culture   Final    NO GROWTH 5 DAYS Performed at [facility], [address], [place], [place] [address]    Report Status 04/13/2019 FINAL  Final  Blood culture (routine x 2)     Status: None   Collection Time: 04/08/19 10:40 PM   Specimen: BLOOD  Result Value Ref Range Status   Specimen Description BLOOD LEFT ARM  Final   Special Requests   Final    BOTTLES DRAWN AEROBIC AND ANAEROBIC Blood Culture results may not be optimal due to an inadequate volume of blood received in culture bottles   Culture   Final    NO GROWTH 5 DAYS Performed at [facility], [place] [address] [place], [place] [address]    Report Status 04/13/2019 FINAL  Final  SARS Coronavirus 2 [facility] order, Performed in [facility] hospital lab) Nasopharyngeal Nasopharyngeal Swab     Status: None   Collection Time: 04/08/19 11:30 PM   Specimen: Nasopharyngeal Swab  Result Value Ref Range Status   SARS Coronavirus 2 NEGATIVE NEGATIVE Final    Comment: (NOTE) If  result is NEGATIVE SARS-CoV-2 target nucleic acids are NOT DETECTED. The SARS-CoV-2 RNA is generally detectable in upper and lower  respiratory specimens during the acute phase of infection. The lowest  concentration of SARS-CoV-2 viral copies this assay can detect is 250  copies / mL. A negative  result does not preclude SARS-CoV-2 infection  and should not be used as the sole basis for treatment or other  patient management decisions.  A negative result may occur with  improper specimen collection / handling, submission of specimen other  than nasopharyngeal swab, presence of viral mutation(s) within the  areas targeted by this assay, and inadequate number of viral copies  (<250 copies / mL). A negative result must be combined with clinical  observations, patient history, and epidemiological information. If result is POSITIVE SARS-CoV-2 target nucleic acids are DETECTED. The SARS-CoV-2 RNA is generally detectable in upper and lower  respiratory specimens dur ing the acute phase of infection.  Positive  results are indicative of active infection with SARS-CoV-2.  Clinical  correlation with patient history and other diagnostic information is  necessary to determine patient infection status.  Positive results do  not rule out bacterial infection or co-infection with other viruses. If result is PRESUMPTIVE POSTIVE SARS-CoV-2 nucleic acids MAY BE PRESENT.   A presumptive positive result was obtained on the submitted specimen  and confirmed on repeat testing.  While 2019 novel coronavirus  (SARS-CoV-2) nucleic acids may be present in the submitted sample  additional confirmatory testing may be necessary for epidemiological  and / or clinical management purposes  to differentiate between  SARS-CoV-2 and other Sarbecovirus currently known to infect humans.  If clinically indicated additional testing with an alternate test  methodology [phone]) is advised. The SARS-CoV-2 RNA is generally   detectable in upper and lower respiratory sp ecimens during the acute  phase of infection. The expected result is Negative. Fact Sheet for Patients:  StrictlyIdeas.no Fact Sheet for Healthcare Providers: BankingDealers.co.za This test is not yet approved or cleared by the [place] FDA and has been authorized for detection and/or diagnosis of SARS-CoV-2 by FDA under an Emergency Use Authorization (EUA).  This EUA will remain in effect (meaning this test can be used) for the duration of the COVID-19 declaration under Section 564(b)(1) of the Act, 21 U.S.C. section 360bbb-3(b)(1), unless the authorization is terminated or revoked sooner. Performed at [facility], [place] [address], [place], [place] [address]   Expectorated sputum assessment w rflx to resp cult     Status: None   Collection Time: 04/12/19 11:41 AM   Specimen: Sputum  Result Value Ref Range Status   Specimen Description SPU  Final   Special Requests NONE  Final   Sputum evaluation   Final    THIS SPECIMEN IS ACCEPTABLE FOR SPUTUM CULTURE Performed at [facility], [place] [address] [place], [place] [address]    Report Status 04/12/2019 FINAL  Final  Culture, respiratory     Status: None   Collection Time: 04/12/19 11:41 AM   Specimen: Sputum  Result Value Ref Range Status   Specimen Description SPU  Final   Special Requests NONE Reflexed from [id number]  Final   Gram Stain   Final    RARE WBC PRESENT, PREDOMINANTLY PMN FEW GRAM POSITIVE COCCI FEW YEAST RARE GRAM NEGATIVE RODS    Culture   Final    Consistent with normal respiratory flora. Performed at [facility], [place] [address] [place], [place] [address]    Report Status 04/14/2019 FINAL  Final     Labs: BNP (last 3 results) Recent Labs    04/09/19 1205  BNP 643.3*   Basic Metabolic Panel: Recent Labs  Lab 04/08/19 1436 04/10/19 0504 04/12/19 0421  NA 138 137 140  K 4.1 4.6 3.8   CL 103 106 107  CO2 25 23 24   GLUCOSE 107* 172* 82  BUN 25* 23 24*  CREATININE 1.13 0.86 0.88  CALCIUM 9.2 8.7* 8.4*   Liver Function Tests: No results for input(s): AST, ALT, ALKPHOS, BILITOT, PROT, ALBUMIN in the last 168 hours. No results for input(s): LIPASE, AMYLASE in the last 168 hours. No results for input(s): AMMONIA in the last 168 hours. CBC: Recent Labs  Lab 04/08/19 1436 04/10/19 0504 04/12/19 0421  WBC 8.4 11.9* 10.9*  HGB 13.1 12.3* 12.4*  HCT 41.2 38.2* 37.9*  MCV 101.0* 100.3* 98.7  PLT 426* 458* 538*   Cardiac Enzymes: No results for input(s): CKTOTAL, CKMB, CKMBINDEX, TROPONINI in the last 168 hours. BNP: Invalid input(s): POCBNP CBG: No results for input(s): GLUCAP in the last 168 hours. D-Dimer No results for input(s): DDIMER in the last 72 hours. Hgb A1c No results for input(s): HGBA1C in the last 72 hours. Lipid Profile No results for input(s): CHOL, HDL, LDLCALC, TRIG, CHOLHDL, LDLDIRECT in the last 72 hours. Thyroid function studies No results for input(s): TSH, T4TOTAL, T3FREE, THYROIDAB in the last 72 hours.  Invalid input(s): FREET3 Anemia work up No results for input(s): VITAMINB12, FOLATE, FERRITIN, TIBC, IRON, RETICCTPCT in the last 72 hours. Urinalysis No results found for: COLORURINE, APPEARANCEUR, [place], [place], [place], [place], [place], [place], PROTEINUR, UROBILINOGEN, NITRITE, LEUKOCYTESUR Sepsis Labs Invalid input(s): PROCALCITONIN,  WBC,  LACTICIDVEN Microbiology Recent Results (from the past 240 hour(s))  Blood culture (routine x 2)     Status: None   Collection Time: 04/08/19 10:34 PM   Specimen: BLOOD  Result Value Ref Range Status   Specimen Description BLOOD LEFT ARM  Final   Special Requests   Final    BOTTLES DRAWN AEROBIC AND ANAEROBIC Blood Culture adequate volume   Culture   Final    NO GROWTH 5 DAYS Performed at [facility], [address], [place], [place] [address]    Report Status  04/13/2019 FINAL  Final  Blood culture (routine x 2)     Status: None   Collection Time: 04/08/19 10:40 PM   Specimen: BLOOD  Result Value Ref Range Status   Specimen Description BLOOD LEFT ARM  Final   Special Requests   Final    BOTTLES DRAWN AEROBIC AND ANAEROBIC Blood Culture results may not be optimal due to an inadequate volume of blood received in culture bottles   Culture   Final    NO GROWTH 5 DAYS Performed at [facility], [place] [address], [place], [place] [address]    Report Status 04/13/2019 FINAL  Final  SARS Coronavirus 2 [facility] order, Performed in [facility] hospital lab) Nasopharyngeal Nasopharyngeal Swab     Status: None   Collection Time: 04/08/19 11:30 PM   Specimen: Nasopharyngeal Swab  Result Value Ref Range Status   SARS Coronavirus 2 NEGATIVE NEGATIVE Final    Comment: (NOTE) If result is NEGATIVE SARS-CoV-2 target nucleic acids are NOT DETECTED. The SARS-CoV-2 RNA is generally detectable in upper and lower  respiratory specimens during the acute phase of infection. The lowest  concentration of SARS-CoV-2 viral copies this assay can detect is 250  copies / mL. A negative result does not preclude SARS-CoV-2 infection  and should not be used as the sole basis for treatment or other  patient management decisions.  A negative result may occur with  improper specimen collection / handling, submission of specimen other  than  nasopharyngeal swab, presence of viral mutation(s) within the  areas targeted by this assay, and inadequate number of viral copies  (<250 copies / mL). A negative result must be combined with clinical  observations, patient history, and epidemiological information. If result is POSITIVE SARS-CoV-2 target nucleic acids are DETECTED. The SARS-CoV-2 RNA is generally detectable in upper and lower  respiratory specimens dur ing the acute phase of infection.  Positive  results are indicative of active infection with SARS-CoV-2.  Clinical   correlation with patient history and other diagnostic information is  necessary to determine patient infection status.  Positive results do  not rule out bacterial infection or co-infection with other viruses. If result is PRESUMPTIVE POSTIVE SARS-CoV-2 nucleic acids MAY BE PRESENT.   A presumptive positive result was obtained on the submitted specimen  and confirmed on repeat testing.  While 2019 novel coronavirus  (SARS-CoV-2) nucleic acids may be present in the submitted sample  additional confirmatory testing may be necessary for epidemiological  and / or clinical management purposes  to differentiate between  SARS-CoV-2 and other Sarbecovirus currently known to infect humans.  If clinically indicated additional testing with an alternate test  methodology [phone]) is advised. The SARS-CoV-2 RNA is generally  detectable in upper and lower respiratory sp ecimens during the acute  phase of infection. The expected result is Negative. Fact Sheet for Patients:  StrictlyIdeas.no Fact Sheet for Healthcare Providers: BankingDealers.co.za This test is not yet approved or cleared by the [place] FDA and has been authorized for detection and/or diagnosis of SARS-CoV-2 by FDA under an Emergency Use Authorization (EUA).  This EUA will remain in effect (meaning this test can be used) for the duration of the COVID-19 declaration under Section 564(b)(1) of the Act, 21 U.S.C. section 360bbb-3(b)(1), unless the authorization is terminated or revoked sooner. Performed at [facility], [place] [address], [place], [place] [address]   Expectorated sputum assessment w rflx to resp cult     Status: None   Collection Time: 04/12/19 11:41 AM   Specimen: Sputum  Result Value Ref Range Status   Specimen Description SPU  Final   Special Requests NONE  Final   Sputum evaluation   Final    THIS SPECIMEN IS ACCEPTABLE FOR SPUTUM CULTURE Performed at  [facility], [place] [address] [place], [place] [address]    Report Status 04/12/2019 FINAL  Final  Culture, respiratory     Status: None   Collection Time: 04/12/19 11:41 AM   Specimen: Sputum  Result Value Ref Range Status   Specimen Description SPU  Final   Special Requests NONE Reflexed from [id number]  Final   Gram Stain   Final    RARE WBC PRESENT, PREDOMINANTLY PMN FEW GRAM POSITIVE COCCI FEW YEAST RARE GRAM NEGATIVE RODS    Culture   Final    Consistent with normal respiratory flora. Performed at [facility], [place] [address] [place], [place] [address]    Report Status 04/14/2019 FINAL  Final     Time coordinating discharge: 32  minutes  SIGNED:   Hosie Poisson, MD  [facility] 04/14/2019, 8:59 AM Pager   If 7PM-7AM, please contact night-coverage [email or web address] Password [email or web address]

## 2020-01-31 IMAGING — CT CT CHEST W/O CM
2 of 4 series · 15 of 36 positions shown, 18 images · non-contrast
Comparison: None.

CLINICAL DATA: Pneumonia

EXAM:
CT CHEST WITHOUT CONTRAST
TECHNIQUE: Multidetector CT imaging of the chest was performed following the
standard protocol without IV contrast.

[Series 3: chest wo · axial · 0.75mm/px · z∈[+1101,+1365]mm · 12 of 157 slices shown, 15 images]
[im 13/157  mediastinal]
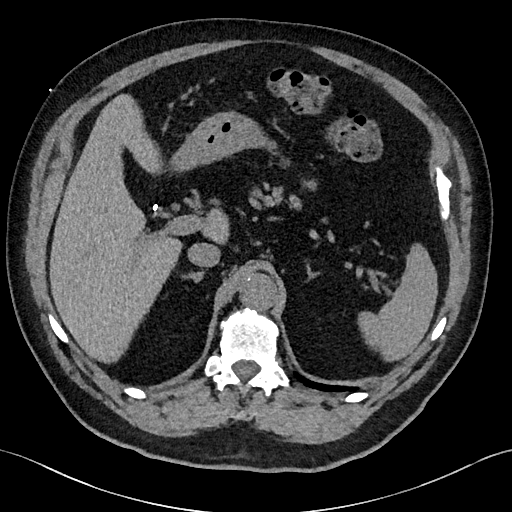
[im 13/157  lung]
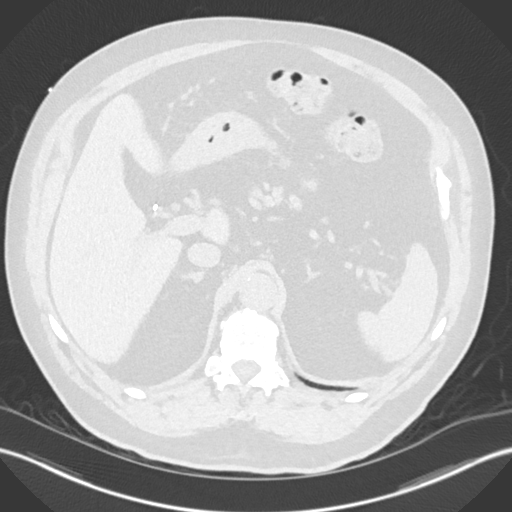
[im 25/157  lung]
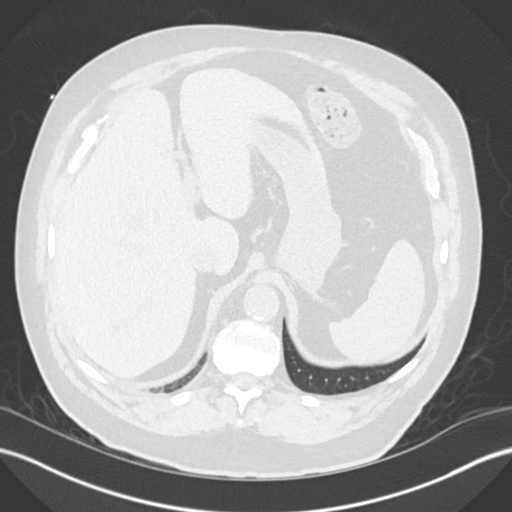
[im 37/157  lung]
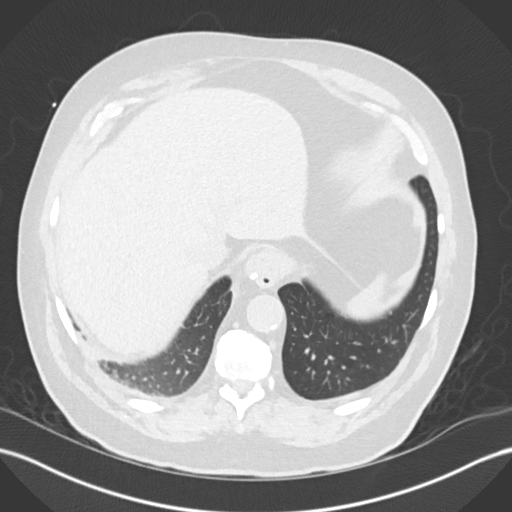
[im 49/157  lung]
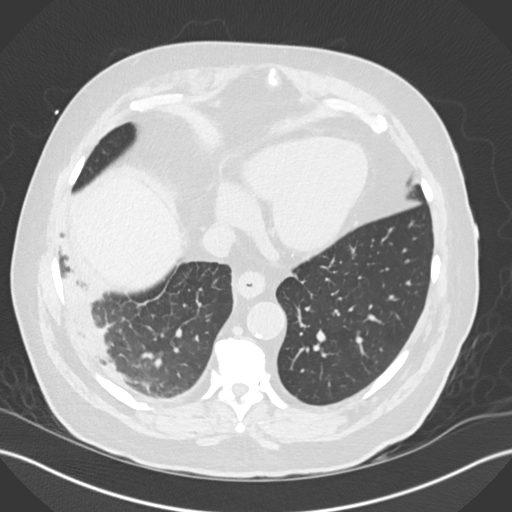
[im 61/157  mediastinal]
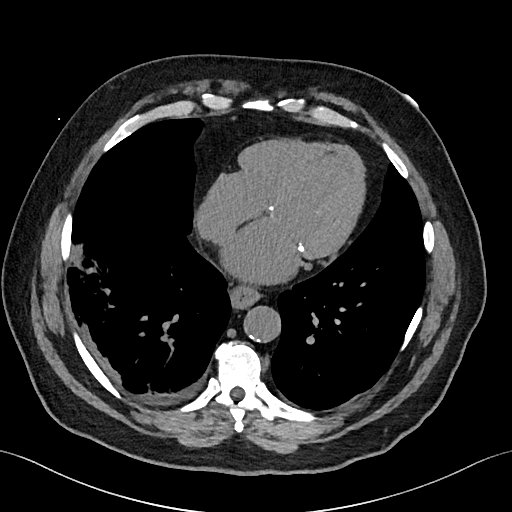
[im 61/157  lung]
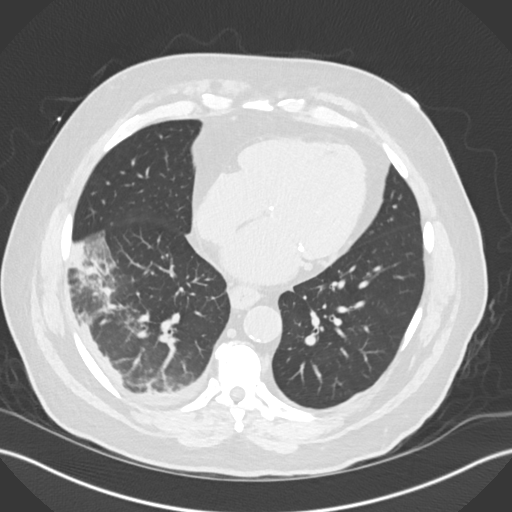
[im 73/157  lung]
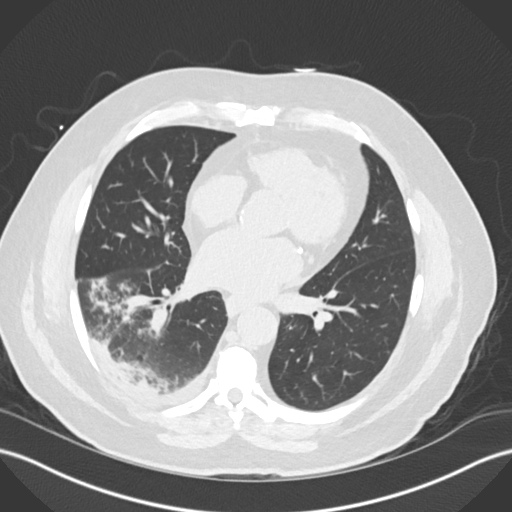
[im 85/157  lung]
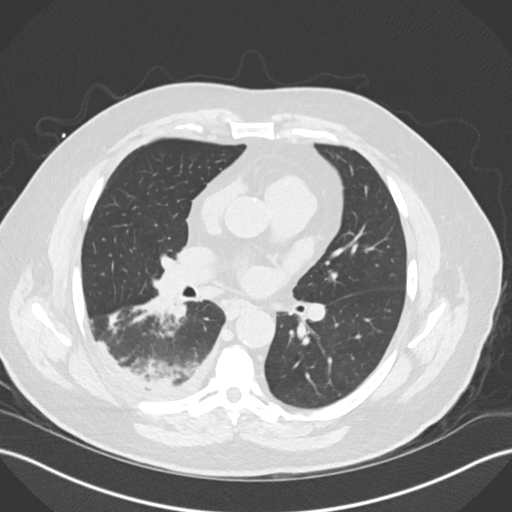
[im 97/157  lung]
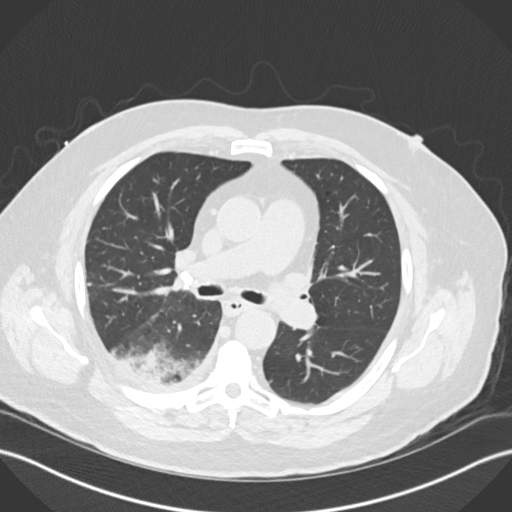
[im 109/157  mediastinal]
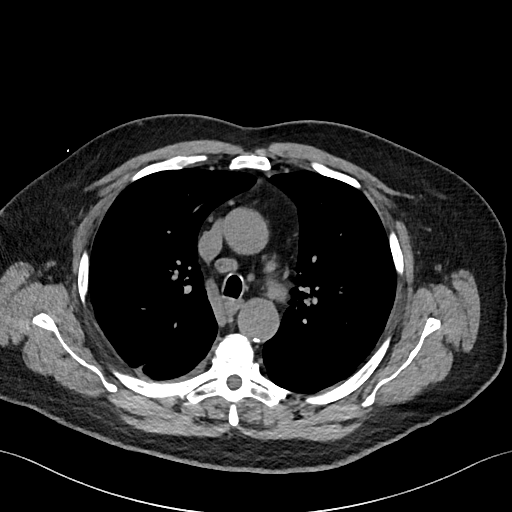
[im 109/157  lung]
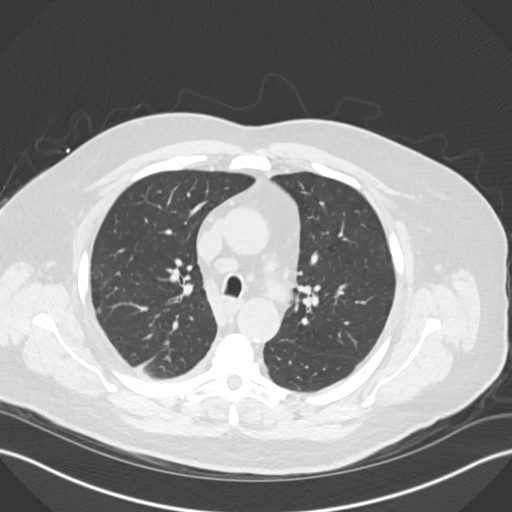
[im 121/157  lung]
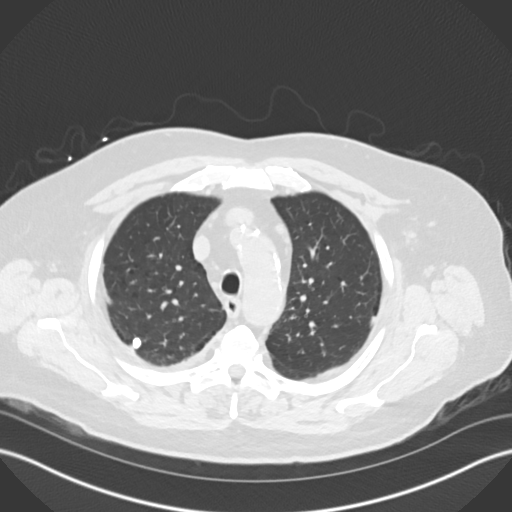
[im 133/157  lung]
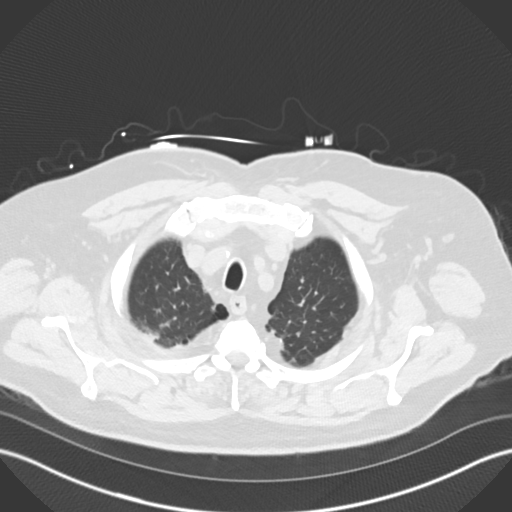
[im 145/157  lung]
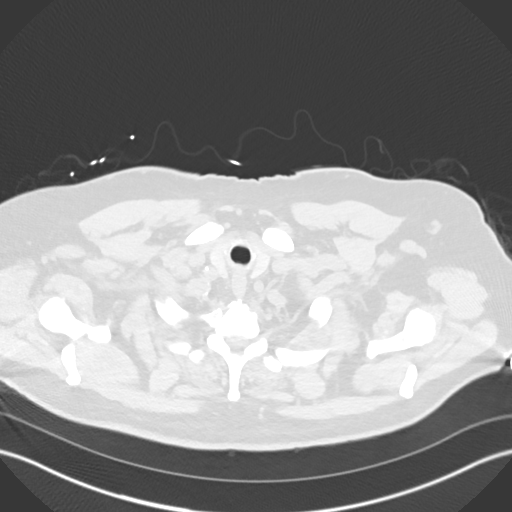

[Series 6: cor · coronal · 0.62mm/px · 3 of 160 slices shown]
[im 32/160  lung]
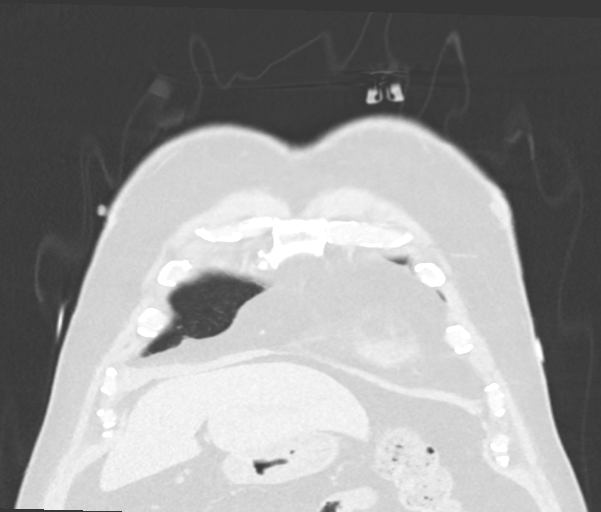
[im 64/160  lung]
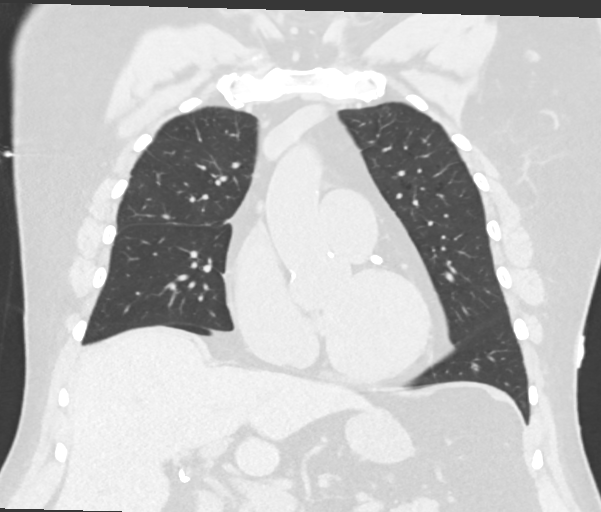
[im 96/160  lung]
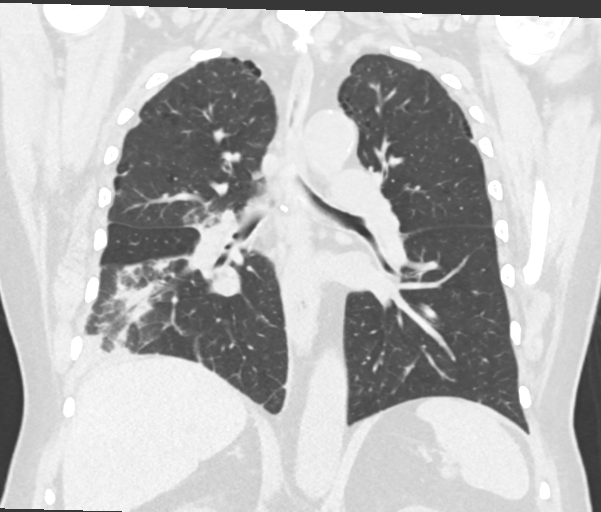

[15 of 36 positions shown; findings below may reference images not displayed]

FINDINGS: Cardiovascular: Heart size is normal. Atherosclerotic changes are
noted of the thoracic aorta. There are atherosclerotic changes of
the coronary arteries. The main pulmonary artery is mildly dilated
measuring approximately 3.4 cm in diameter. There is no significant
pericardial effusion.

Mediastinum/Nodes:

--there are calcified hilar and mediastinal lymph nodes, likely
related to prior granulomatous disease.

--No axillary lymphadenopathy.

--No supraclavicular lymphadenopathy.

--Normal thyroid gland.

--there is some mild diffuse wall thickening of the distal
esophagus.

Lungs/Pleura: Mild emphysematous changes are noted. There is
pleuroparenchymal scarring at the lung apices. A calcified granuloma
is noted in the right upper lobe. There is consolidation involving
the right lower lobe. There is an 8 mm pulmonary nodule in the left
lower lobe (axial series 5, image 128). There is no pneumothorax.
The trachea is unremarkable. There is no significant pleural
effusion.

Upper Abdomen: The patient is status post prior cholecystectomy.

Musculoskeletal: No chest wall abnormality. No acute or significant
osseous findings.
IMPRESSION: 1. Right lower lobe pneumonia.
2. 8 mm pulmonary nodule in the left lower lobe. Non-contrast chest
CT at 6-12 months is recommended. If the nodule is stable at time of
repeat CT, then future CT at 18-24 months (from today's scan) is
considered optional for low-risk patients, but is recommended for
high-risk patients. This recommendation follows the consensus
statement: Guidelines for Management of Incidental Pulmonary Nodules
Detected on CT Images: From the [HOSPITAL] 6236; Radiology
6236; [DATE].
3. Mildly dilated main pulmonary artery which can be seen in
patients with elevated pulmonary artery pressures.
4. Mild diffuse wall thickening of the distal esophagus which is
nonspecific but can be seen in patients with esophagitis.

Aortic Atherosclerosis (T20Y7-EB8.8) and Emphysema (T20Y7-509.9).

## 2021-01-17 ENCOUNTER — Encounter (HOSPITAL_COMMUNITY): Payer: Self-pay

## 2021-01-17 ENCOUNTER — Inpatient Hospital Stay (HOSPITAL_COMMUNITY)
Admission: EM | Admit: 2021-01-17 | Discharge: 2021-01-20 | DRG: 281 | Disposition: A | Discharge status: Home / Self Care | Payer: No Typology Code available for payment source | Attending: Internal Medicine | Admitting: Internal Medicine

## 2021-01-17 ENCOUNTER — Inpatient Hospital Stay (HOSPITAL_COMMUNITY): Payer: No Typology Code available for payment source

## 2021-01-17 ENCOUNTER — Other Ambulatory Visit: Payer: Self-pay

## 2021-01-17 ENCOUNTER — Emergency Department (HOSPITAL_COMMUNITY): Payer: No Typology Code available for payment source

## 2021-01-17 DIAGNOSIS — N179 Acute kidney failure, unspecified: Secondary | ICD-10-CM | POA: Diagnosis present

## 2021-01-17 DIAGNOSIS — Z87891 Personal history of nicotine dependence: Secondary | ICD-10-CM

## 2021-01-17 DIAGNOSIS — I252 Old myocardial infarction: Secondary | ICD-10-CM

## 2021-01-17 DIAGNOSIS — Y831 Surgical operation with implant of artificial internal device as the cause of abnormal reaction of the patient, or of later complication, without mention of misadventure at the time of the procedure: Secondary | ICD-10-CM | POA: Diagnosis present

## 2021-01-17 DIAGNOSIS — Z888 Allergy status to other drugs, medicaments and biological substances status: Secondary | ICD-10-CM | POA: Diagnosis not present

## 2021-01-17 DIAGNOSIS — I25118 Atherosclerotic heart disease of native coronary artery with other forms of angina pectoris: Secondary | ICD-10-CM | POA: Diagnosis present

## 2021-01-17 DIAGNOSIS — M545 Low back pain, unspecified: Secondary | ICD-10-CM | POA: Diagnosis present

## 2021-01-17 DIAGNOSIS — I214 Non-ST elevation (NSTEMI) myocardial infarction: Secondary | ICD-10-CM | POA: Diagnosis present

## 2021-01-17 DIAGNOSIS — T82855A Stenosis of coronary artery stent, initial encounter: Secondary | ICD-10-CM | POA: Diagnosis present

## 2021-01-17 DIAGNOSIS — J439 Emphysema, unspecified: Secondary | ICD-10-CM | POA: Diagnosis present

## 2021-01-17 DIAGNOSIS — Z20822 Contact with and (suspected) exposure to covid-19: Secondary | ICD-10-CM | POA: Diagnosis present

## 2021-01-17 DIAGNOSIS — N1832 Chronic kidney disease, stage 3b: Secondary | ICD-10-CM | POA: Diagnosis present

## 2021-01-17 DIAGNOSIS — N1831 Chronic kidney disease, stage 3a: Secondary | ICD-10-CM | POA: Diagnosis present

## 2021-01-17 DIAGNOSIS — Z79899 Other long term (current) drug therapy: Secondary | ICD-10-CM

## 2021-01-17 DIAGNOSIS — C3490 Malignant neoplasm of unspecified part of unspecified bronchus or lung: Secondary | ICD-10-CM | POA: Diagnosis present

## 2021-01-17 DIAGNOSIS — Z96653 Presence of artificial knee joint, bilateral: Secondary | ICD-10-CM | POA: Diagnosis present

## 2021-01-17 DIAGNOSIS — Z7982 Long term (current) use of aspirin: Secondary | ICD-10-CM | POA: Diagnosis not present

## 2021-01-17 DIAGNOSIS — K219 Gastro-esophageal reflux disease without esophagitis: Secondary | ICD-10-CM | POA: Diagnosis present

## 2021-01-17 DIAGNOSIS — Z7902 Long term (current) use of antithrombotics/antiplatelets: Secondary | ICD-10-CM

## 2021-01-17 DIAGNOSIS — R072 Precordial pain: Secondary | ICD-10-CM | POA: Diagnosis not present

## 2021-01-17 DIAGNOSIS — I21A1 Myocardial infarction type 2: Secondary | ICD-10-CM | POA: Diagnosis present

## 2021-01-17 DIAGNOSIS — Z8701 Personal history of pneumonia (recurrent): Secondary | ICD-10-CM

## 2021-01-17 DIAGNOSIS — N189 Chronic kidney disease, unspecified: Secondary | ICD-10-CM

## 2021-01-17 DIAGNOSIS — Z880 Allergy status to penicillin: Secondary | ICD-10-CM | POA: Diagnosis not present

## 2021-01-17 DIAGNOSIS — R079 Chest pain, unspecified: Secondary | ICD-10-CM

## 2021-01-17 DIAGNOSIS — I480 Paroxysmal atrial fibrillation: Secondary | ICD-10-CM | POA: Diagnosis not present

## 2021-01-17 DIAGNOSIS — E8770 Fluid overload, unspecified: Secondary | ICD-10-CM | POA: Diagnosis present

## 2021-01-17 DIAGNOSIS — I4891 Unspecified atrial fibrillation: Principal | ICD-10-CM | POA: Diagnosis present

## 2021-01-17 DIAGNOSIS — I129 Hypertensive chronic kidney disease with stage 1 through stage 4 chronic kidney disease, or unspecified chronic kidney disease: Secondary | ICD-10-CM | POA: Diagnosis present

## 2021-01-17 DIAGNOSIS — Z955 Presence of coronary angioplasty implant and graft: Secondary | ICD-10-CM | POA: Diagnosis not present

## 2021-01-17 DIAGNOSIS — I2510 Atherosclerotic heart disease of native coronary artery without angina pectoris: Secondary | ICD-10-CM | POA: Diagnosis not present

## 2021-01-17 DIAGNOSIS — M109 Gout, unspecified: Secondary | ICD-10-CM | POA: Diagnosis present

## 2021-01-17 DIAGNOSIS — R001 Bradycardia, unspecified: Secondary | ICD-10-CM | POA: Diagnosis present

## 2021-01-17 DIAGNOSIS — R911 Solitary pulmonary nodule: Secondary | ICD-10-CM | POA: Diagnosis not present

## 2021-01-17 DIAGNOSIS — I482 Chronic atrial fibrillation, unspecified: Secondary | ICD-10-CM

## 2021-01-17 DIAGNOSIS — R778 Other specified abnormalities of plasma proteins: Secondary | ICD-10-CM | POA: Diagnosis not present

## 2021-01-17 DIAGNOSIS — G8929 Other chronic pain: Secondary | ICD-10-CM | POA: Diagnosis present

## 2021-01-17 LAB — COMPREHENSIVE METABOLIC PANEL
ALT: 19 U/L (ref 0–44)
AST: 21 U/L (ref 15–41)
Albumin: 3.6 g/dL (ref 3.5–5.0)
Alkaline Phosphatase: 89 U/L (ref 38–126)
Anion gap: 11 (ref 5–15)
BUN: 32 mg/dL — ABNORMAL HIGH (ref 8–23)
CO2: 21 mmol/L — ABNORMAL LOW (ref 22–32)
Calcium: 9.8 mg/dL (ref 8.9–10.3)
Chloride: 102 mmol/L (ref 98–111)
Creatinine, Ser: 2.13 mg/dL — ABNORMAL HIGH (ref 0.61–1.24)
GFR, Estimated: 32 mL/min — ABNORMAL LOW (ref >60)
Glucose, Bld: 115 mg/dL — ABNORMAL HIGH (ref 70–99)
Potassium: 3.7 mmol/L (ref 3.5–5.1)
Sodium: 134 mmol/L — ABNORMAL LOW (ref 135–145)
Total Bilirubin: 1 mg/dL (ref 0.3–1.2)
Total Protein: 7 g/dL (ref 6.5–8.1)

## 2021-01-17 LAB — CBC WITH DIFFERENTIAL/PLATELET
Abs Immature Granulocytes: 0.03 10*3/uL (ref 0.00–0.07)
Basophils Absolute: 0.1 10*3/uL (ref 0.0–0.1)
Basophils Relative: 1 %
Eosinophils Absolute: 0.2 10*3/uL (ref 0.0–0.5)
Eosinophils Relative: 3 %
HCT: 35 % — ABNORMAL LOW (ref 39.0–52.0)
Hemoglobin: 11.5 g/dL — ABNORMAL LOW (ref 13.0–17.0)
Immature Granulocytes: 1 %
Lymphocytes Relative: 28 %
Lymphs Abs: 1.6 10*3/uL (ref 0.7–4.0)
MCH: 31.9 pg (ref 26.0–34.0)
MCHC: 32.9 g/dL (ref 30.0–36.0)
MCV: 97.2 fL (ref 80.0–100.0)
Monocytes Absolute: 0.6 10*3/uL (ref 0.1–1.0)
Monocytes Relative: 11 %
Neutro Abs: 3.3 10*3/uL (ref 1.7–7.7)
Neutrophils Relative %: 56 %
Platelets: 254 10*3/uL (ref 150–400)
RBC: 3.6 MIL/uL — ABNORMAL LOW (ref 4.22–5.81)
RDW: 13.1 % (ref 11.5–15.5)
WBC: 5.8 10*3/uL (ref 4.0–10.5)
nRBC: 0 % (ref 0.0–0.2)

## 2021-01-17 LAB — MAGNESIUM: Magnesium: 1.6 mg/dL — ABNORMAL LOW (ref 1.7–2.4)

## 2021-01-17 LAB — BASIC METABOLIC PANEL
Anion gap: 10 (ref 5–15)
BUN: 26 mg/dL — ABNORMAL HIGH (ref 8–23)
CO2: 22 mmol/L (ref 22–32)
Calcium: 9.7 mg/dL (ref 8.9–10.3)
Chloride: 103 mmol/L (ref 98–111)
Creatinine, Ser: 1.6 mg/dL — ABNORMAL HIGH (ref 0.61–1.24)
GFR, Estimated: 45 mL/min — ABNORMAL LOW (ref >60)
Glucose, Bld: 130 mg/dL — ABNORMAL HIGH (ref 70–99)
Potassium: 4.4 mmol/L (ref 3.5–5.1)
Sodium: 135 mmol/L (ref 135–145)

## 2021-01-17 LAB — PROTIME-INR
INR: 1 (ref 0.8–1.2)
INR: 1.1 (ref 0.8–1.2)
Prothrombin Time: 13.6 seconds (ref 11.4–15.2)
Prothrombin Time: 13.8 seconds (ref 11.4–15.2)

## 2021-01-17 LAB — RESP PANEL BY RT-PCR (FLU A&B, COVID) ARPGX2
Influenza A by PCR: NEGATIVE
Influenza B by PCR: NEGATIVE
SARS Coronavirus 2 by RT PCR: NEGATIVE

## 2021-01-17 LAB — TROPONIN I (HIGH SENSITIVITY)
Troponin I (High Sensitivity): 107 ng/L (ref <18)
Troponin I (High Sensitivity): 619 ng/L (ref <18)
Troponin I (High Sensitivity): 1199 ng/L (ref <18)
Troponin I (High Sensitivity): 1026 ng/L (ref <18)
Troponin I (High Sensitivity): 994 ng/L (ref <18)

## 2021-01-17 LAB — ECHOCARDIOGRAM COMPLETE
Area-P 1/2: 3.42 cm2
Height: 69 in
S' Lateral: 2.6 cm
Weight: 3440 oz

## 2021-01-17 LAB — HEPARIN LEVEL (UNFRACTIONATED)
Heparin Unfractionated: <0.1 IU/mL — ABNORMAL LOW (ref 0.30–0.70)
Heparin Unfractionated: 0.49 IU/mL (ref 0.30–0.70)
Heparin Unfractionated: 0.31 IU/mL (ref 0.30–0.70)

## 2021-01-17 LAB — BRAIN NATRIURETIC PEPTIDE: B Natriuretic Peptide: 81.9 pg/mL (ref 0.0–100.0)

## 2021-01-17 LAB — APTT: aPTT: 90 seconds — ABNORMAL HIGH (ref 24–36)

## 2021-01-17 MED ORDER — METOPROLOL TARTRATE 5 MG/5ML IV SOLN
2.5000 mg | Freq: Once | INTRAVENOUS | Status: DC
  Filled 2021-01-17 (×2): qty 5

## 2021-01-17 MED ORDER — FLUTICASONE FUROATE-VILANTEROL 200-25 MCG/INH IN AEPB
1.0000 | INHALATION_SPRAY | Freq: Every day | RESPIRATORY_TRACT | Status: DC
  Administered 2021-01-20: 1 via RESPIRATORY_TRACT
  Filled 2021-01-17 (×2): qty 28

## 2021-01-17 MED ORDER — PANTOPRAZOLE SODIUM 40 MG PO TBEC
40.0000 mg | DELAYED_RELEASE_TABLET | Freq: Two times a day (BID) | ORAL | Status: DC
  Administered 2021-01-17 – 2021-01-20 (×6): 40 mg via ORAL
  Filled 2021-01-17 (×6): qty 1

## 2021-01-17 MED ORDER — ONDANSETRON HCL 4 MG/2ML IJ SOLN: 4.0000 mg | Freq: Four times a day (QID) | INTRAMUSCULAR | Status: DC | PRN

## 2021-01-17 MED ORDER — POLYVINYL ALCOHOL 1.4 % OP SOLN
1.0000 [drp] | Freq: Three times a day (TID) | OPHTHALMIC | Status: DC | PRN
  Filled 2021-01-17: qty 15

## 2021-01-17 MED ORDER — HEPARIN BOLUS VIA INFUSION
4000.0000 [IU] | Freq: Once | INTRAVENOUS | Status: AC
  Administered 2021-01-17: 4000 [IU] via INTRAVENOUS
  Filled 2021-01-17: qty 4000

## 2021-01-17 MED ORDER — NITROGLYCERIN 0.4 MG SL SUBL: 0.4000 mg | SUBLINGUAL_TABLET | SUBLINGUAL | Status: DC | PRN

## 2021-01-17 MED ORDER — HEPARIN (PORCINE) 25000 UT/250ML-% IV SOLN
1500.0000 [IU]/h | INTRAVENOUS | Status: DC
  Administered 2021-01-17: 1250 [IU]/h via INTRAVENOUS
  Administered 2021-01-18: 1300 [IU]/h via INTRAVENOUS
  Filled 2021-01-17 (×3): qty 250

## 2021-01-17 MED ORDER — NITROGLYCERIN 0.4 MG SL SUBL
0.4000 mg | SUBLINGUAL_TABLET | SUBLINGUAL | Status: DC | PRN
  Administered 2021-01-17: 0.4 mg via SUBLINGUAL
  Filled 2021-01-17: qty 1

## 2021-01-17 MED ORDER — ACETAMINOPHEN 325 MG PO TABS
650.0000 mg | ORAL_TABLET | ORAL | Status: DC | PRN
  Administered 2021-01-18 – 2021-01-19 (×2): 650 mg via ORAL
  Filled 2021-01-17 (×2): qty 2

## 2021-01-17 MED ORDER — ASPIRIN EC 81 MG PO TBEC
81.0000 mg | DELAYED_RELEASE_TABLET | Freq: Every day | ORAL | Status: DC
  Administered 2021-01-18 – 2021-01-20 (×2): 81 mg via ORAL
  Filled 2021-01-17 (×2): qty 1

## 2021-01-17 MED ORDER — PRAVASTATIN SODIUM 40 MG PO TABS
20.0000 mg | ORAL_TABLET | Freq: Every day | ORAL | Status: DC
  Administered 2021-01-17 – 2021-01-19 (×3): 20 mg via ORAL
  Filled 2021-01-17 (×6): qty 1

## 2021-01-17 MED ORDER — ALBUTEROL SULFATE (2.5 MG/3ML) 0.083% IN NEBU: 2.5000 mg | INHALATION_SOLUTION | Freq: Four times a day (QID) | RESPIRATORY_TRACT | Status: DC | PRN

## 2021-01-17 MED ORDER — EZETIMIBE 10 MG PO TABS
5.0000 mg | ORAL_TABLET | Freq: Every day | ORAL | Status: DC
  Administered 2021-01-17 – 2021-01-20 (×4): 5 mg via ORAL
  Filled 2021-01-17: qty 0.5
  Filled 2021-01-17 (×3): qty 1

## 2021-01-17 NOTE — H&P (Signed)
Date: 01/17/2021               Patient Name:  Tony Garcia MRN: [id number]  DOB: 1945-11-25 Age / Sex: 75 y.o., male   PCP: [name], MD              Medical Service: Internal Medicine Teaching Service              Attending Physician: Dr. Velna Ochs, MD    First Contact: Vern Claude, MS 4 Pager: [phone]  Second Contact: Dr. Tamsen Snider Pager: [phone]            After Hours (After 5p/  First Contact Pager: [phone]  weekends / holidays): Second Contact Pager: [phone]   Chief Complaint: Chest Pain  History of Present Illness: Tony Garcia is a 75 year old male was past medical history of CAD, HTN, HLD, carotid artery disease, CKD and recently diagnosed lung nodule who was brought in by EMS due to chest pain. Patient experienced shortness of breath when he woke up around 6 A.M this morning and it transitioned to central chest pain. He describes the pain as pressure, achy and constant in center of chest , and the pain lasted for more than 25 minutes . He was concerned it was not going away and continued to worsen so he called 911. Patient has been having similar chest pain but they usually last for less than a minute and goes away on without interventions.  Initially he thought it was a COPD exacerbation and use his albuterol inhaler with no relief. Says he has nitroglycerin but forgot to take it. Patient endorsed swelling in legs, generalized weakness shortness of breath. Denies dysuria, changes in vision, hearing problems or abdominal pain. No unexplained weight loss.  Last PCI was back in June 2017 at [facility], this was done after his routine stress test at the [facility] hospital came back abnormal.  He did not have any chest pain back then. Cardiac catheterization revealed significant restenosis in the proximal LAD stent that was treated with another drug-eluting stent.  EMS gave him aspirin and nitroglycerin en-route to the hospital with significant  relieve. In the ED, he was found to have new A.fib with rapid ventricular response but self converted to sinus rhythm. ECG shows persistent ST-depression in inferolateral leads and positive up-trending troponin. Was started on heparin drip.   Meds: Current Facility-Administered Medications  Medication Dose Route Frequency Provider Last Rate Last Admin   acetaminophen (TYLENOL) tablet 650 mg  650 mg Oral Q4H PRN Madalyn Rob, MD       albuterol (PROVENTIL) (2.5 MG/3ML) 0.083% nebulizer solution 2.5 mg  2.5 mg Nebulization Q6H PRN Madalyn Rob, MD       ezetimibe (ZETIA) tablet 5 mg  5 mg Oral Daily Madalyn Rob, MD       fluticasone furoate-vilanterol (BREO ELLIPTA) 200-25 MCG/INH 1 puff  1 puff Inhalation Daily Madalyn Rob, MD       heparin ADULT infusion 100 units/mL (25000 units/275mL)  1,250 Units/hr Intravenous Continuous Madalyn Rob, MD 12.5 mL/hr at 01/17/21 0834 1,250 Units/hr at 01/17/21 0834   metoprolol tartrate (LOPRESSOR) injection 2.5 mg  2.5 mg Intravenous Once Madalyn Rob, MD       nitroGLYCERIN (NITROSTAT) SL tablet 0.4 mg  0.4 mg Sublingual Q5 min PRN Madalyn Rob, MD   0.4 mg at 01/17/21 0845   ondansetron (ZOFRAN) injection 4 mg  4 mg Intravenous Q6H PRN Madalyn Rob, MD  pantoprazole (PROTONIX) EC tablet 40 mg  40 mg Oral BID Madalyn Rob, MD       polyvinyl alcohol (LIQUIFILM TEARS) 1.4 % ophthalmic solution 1 drop  1 drop Both Eyes TID PRN Madalyn Rob, MD       pravastatin (PRAVACHOL) tablet 20 mg  20 mg Oral q1800 Madalyn Rob, MD       Current Outpatient Medications  Medication Sig Dispense Refill   albuterol (PROVENTIL) (2.5 MG/3ML) 0.083% nebulizer solution Take 2.5 mg by nebulization every 6 (six) hours as needed for wheezing or shortness of breath.     albuterol (VENTOLIN HFA) 108 (90 Base) MCG/ACT inhaler Inhale 2 puffs into the lungs every 6 (six) hours as needed for wheezing or shortness of breath.     aspirin EC 81 MG tablet Take 81 mg by mouth daily.      cetirizine (ZYRTEC) 10 MG tablet Take 10 mg by mouth at bedtime.     clopidogrel (PLAVIX) 75 MG tablet Take 75 mg by mouth daily.     ezetimibe (ZETIA) 10 MG tablet Take 5 mg by mouth daily.     fluticasone (FLONASE) 50 MCG/ACT nasal spray Place 1-2 sprays into both nostrils daily as needed for allergies or rhinitis.     fluticasone-salmeterol (ADVAIR) 250-50 MCG/ACT AEPB Take 1 puff by mouth 2 (two) times daily. Rinse mouth after each use     hydrochlorothiazide (HYDRODIURIL) 25 MG tablet Take 12.5 mg by mouth daily. Hold dose for systolic BP less than 528; keep well hydrated.     hydroxypropyl methylcellulose / hypromellose (ISOPTO TEARS / GONIOVISC) 2.5 % ophthalmic solution Place 1 drop into both eyes 3 (three) times daily as needed for dry eyes.     lisinopril (ZESTRIL) 40 MG tablet Take 40 mg by mouth daily.     lovastatin (MEVACOR) 20 MG tablet Take 60 mg by mouth at bedtime.     nitroGLYCERIN (NITROSTAT) 0.4 MG SL tablet Place 0.4 mg under the tongue every 5 (five) minutes as needed for chest pain.     Omega-3 Fatty Acids (FISH OIL) 1000 MG CAPS Take 2,000 mg by mouth 2 (two) times daily.     pantoprazole (PROTONIX) 40 MG tablet Take 1 tablet (40 mg total) by mouth 2 (two) times daily. 60 tablet 0   sodium chloride (OCEAN) 0.65 % SOLN nasal spray Place 2 sprays into both nostrils as needed for congestion.      Allergies: Allergies as of 01/17/2021 - Review Complete 01/17/2021  Allergen Reaction Noted   Penicillins Shortness Of Breath 03/25/2014   Pravastatin Other (See Comments) 05/30/2016   Rosuvastatin Other (See Comments) 11/14/2009   Past Medical History:  Diagnosis Date   Acute respiratory failure (HCC)    CAD (coronary artery disease)    CAP (community acquired pneumonia)    Chronic low back pain    s/p steroid injections   DOE (dyspnea on exertion)    Dyslipidemia    Emphysema lung (HCC)    GERD (gastroesophageal reflux disease)    Gout    Hypercholesteremia     Hypertension    Myocardial ischemia    Past Surgical History:  Procedure Laterality Date   CHOLECYSTECTOMY     CORONARY ANGIOPLASTY WITH STENT PLACEMENT     x 3    REPLACEMENT TOTAL KNEE BILATERAL     History reviewed. No pertinent family history. Social History   Socioeconomic History   Marital status: Widowed    Spouse name: Not on file  Number of children: Not on file   Years of education: Not on file   Highest education level: Not on file  Occupational History   Not on file  Tobacco Use   Smoking status: Former    Packs/day: 2.00    Years: 30.00    Pack years: 60.00    Types: Cigarettes   Smokeless tobacco: Never  Substance and Sexual Activity   Alcohol use: Yes   Drug use: No   Sexual activity: Not on file  Other Topics Concern   Not on file  Social History Narrative   Lives alone with 2 dogs. Continues to work as part-time Administrator. Eats seafood and takeouts. Does not exercise and has minimal active time except for time spent in gardening.    Social Determinants of Health   Financial Resource Strain: Not on file  Food Insecurity: Not on file  Transportation Needs: Not on file  Physical Activity: Not on file  Stress: Not on file  Social Connections: Not on file  Intimate Partner Violence: Not on file    Review of Systems: Pertinent items are noted in HPI.  Lab results: CBC Latest Ref Rng & Units 01/17/2021 04/12/2019 04/10/2019  WBC 4.0 - 10.5 K/uL 5.8 10.9(H) 11.9(H)  Hemoglobin 13.0 - 17.0 g/dL 11.5(L) 12.4(L) 12.3(L)  Hematocrit 39.0 - 52.0 % 35.0(L) 37.9(L) 38.2(L)  Platelets 150 - 400 K/uL 254 538(H) 458(H)    CMP Latest Ref Rng & Units 01/17/2021 04/12/2019 04/10/2019  Glucose 70 - 99 mg/dL 115(H) 82 172(H)  BUN 8 - 23 mg/dL 32(H) 24(H) 23  Creatinine 0.61 - 1.24 mg/dL 2.13(H) 0.88 0.86  Sodium 135 - 145 mmol/L 134(L) 140 137  Potassium 3.5 - 5.1 mmol/L 3.7 3.8 4.6  Chloride 98 - 111 mmol/L 102 107 106  CO2 22 - 32 mmol/L 21(L) 24 23  Calcium  8.9 - 10.3 mg/dL 9.8 8.4(L) 8.7(L)  Total Protein 6.5 - 8.1 g/dL 7.0 - -  Total Bilirubin 0.3 - 1.2 mg/dL 1.0 - -  Alkaline Phos 38 - 126 U/L 89 - -  AST 15 - 41 U/L 21 - -  ALT 0 - 44 U/L 19 - -     Physical Exam: Blood pressure (!) 150/52, pulse (!) 57, temperature 97.7 F (36.5 C), temperature source Oral, resp. rate 16, height 5\' 9"  (1.753 m), weight 97.5 kg, SpO2 99 %.  General: Pleasant, well-appearing obese male laying in bed. Appears to be in mild discomfort  Head: Normocephalic. Atraumatic. Neck: No JVD, supple CV: RRR. No murmurs, rubs, or gallops. +1 Peripheral edema Pulmonary: Lungs CTAB. Normal effort. No wheezing or rales. Abdominal: Soft, nontender, nondistended. Normal bowel sounds. Extremities: Palpable pulses. Normal ROM. Skin: Warm and dry. No obvious rash or lesions. Neuro: A&Ox3. Moves all extremities. Normal sensation. No focal deficit. Psych: Normal mood and affect   Imaging results:  DG Chest Port 1 View  Result Date: 01/17/2021 CLINICAL DATA:  Chest pain and shortness of breath EXAM: PORTABLE CHEST 1 VIEW COMPARISON:  04/12/2019 FINDINGS: The heart size and mediastinal contours are within normal limits. Both lungs are clear. The visualized skeletal structures are unremarkable. IMPRESSION: No active disease. Electronically Signed   By: Miachel Roux M.D.   On: 01/17/2021 08:08    Other results: EKG: normal sinus rhythm, PAC's noted, Borderline diffuse ST depression.  Assessment & Plan by Problem:   Active Problems:   NSTEMI (non-ST elevated myocardial infarction) [facility]) Mr. Molden is a 75 year old male was past medical  history of CAD, HTN, HLD, carotid artery disease admitted for NSTEMI. He is currently on heparin drip an undergoing medical optimization to correct elevated creatinine in anticipation of heart cath tomorrow.  NSTEMI Presented with chest pain and ECG with persistent ST-depression in inferolateral leads. Positive and up-trending troponin  (107--> 619--> 1199) . Evaluated by Cardiology service and is deferred heart cath until tomorrow so patient can receive appropriate fluid resuscitation to correct possible AKI before administering contrast for heart cath. On review of outside records patients most recent Cr was 1.8, so patient likely has progression of kidney disease or mild AKI.  -Heparin per cardiology recs -Trend troponin to peak ( -Serial ECG or telemetry -PM BMP, fluid -Heart cath tomorrow - Consult cards if patient develops new chest pain.   New A.fib with RVR Patient found to be in A.fib RVR but spontaneously converted to sinus rhythm. A.fib likely precipitated by NSTEMI. No rate control intervention needed. Patient has history of bradycardia. CHA2DS2-Vasc score 4 (age 48, CAD, HTN).  -Continue heparin drip, switch to oral   AKI on CKD Unable to establish clear baseline. Available date in [place] indicate baseline range 0.86-1.1.Review of record from [facility] with the [place] system show creatinine of 1.8.   Creatinine of 2.13 with GFR of 32 on presentation consistent with CKD IIIb. Patient unaware of prior diagnosis of CKD. This could be acute kidney injury or a chronic process. Patient has 1+ pitting edema on exam, albumin 3.6. Now patient is out of atrial fibrillation will check afternoon BMP. Tolerating PO intake, pending afternoon BMP will give IV fluids.  - BMP  Lung nodule: Recently diagnosed, PET scan showed mild hypermetabolic activity concerning for primary bronchogenic carcinoma.  Nodule was too small to be biopsied, therefore patient was referred to pulmonology service to consider endobronchial ultrasound.  -F/u outpatient  This is a Careers information officer Note.  The care of the patient was discussed with Dr. Tamsen Snider MD and the assessment and plan was formulated with their assistance.  Please see their note for official documentation of the patient encounter.   Signed: Ross Ludwig, Medical  Student 01/17/2021, 2:52 PM   Attestation for Student Documentation:  I personally was present and performed or re-performed the history, physical exam and medical decision-making activities of this service and have verified that the service and findings are accurately documented in the student's note.  Madalyn Rob, MD 01/17/2021, 5:25 PM

## 2021-01-17 NOTE — Consult Note (Addendum)
Cardiology Consultation:   Patient ID: Tony Garcia MRN: [id number]; DOB: 11-25-1945  Admit date: 01/17/2021 Date of Consult: 01/17/2021  PCP:  [name], MD   [facility] Providers Cardiologist:  Dr. Filbert Berthold at [facility]     Patient Profile:   Tony Garcia is a 75 y.o. male with a hx of CAD, HTN, HLD, carotid artery disease, CKD and recently diagnosed lung nodule who is being seen 01/17/2021 for the evaluation of atrial fibrillation with RVR and elevated troponin at the request of Dr. Ralene Bathe.  History of Present Illness:   Tony Garcia is a 75 year old male was past medical history of CAD, HTN, HLD, carotid artery disease, CKD and recently diagnosed lung nodule.  Last PCI was performed on 12/22/2015 at [facility] as result of a abnormal routine stress test performed by [facility].  Cardiac catheterization performed at that time revealed 90% in-stent restenosis of proximal LAD stent treated with Xience Alpine 3.0 x 28 mm DES postdilated to 3.25 mm.  He was admitted for pneumonia in September 2020 at [facility].  Repeat echocardiogram performed on 04/10/2019 showed EF 65 to 70%, indeterminate diastolic function, otherwise no significant valve issue.  Carvedilol was discontinued during admission due to bradycardia.  He was left on lisinopril.  Later, hydrochlorothiazide was added to his medical regimen.  According to the patient, he has been going back to the [place] system to see his cardiologist once every 6 months.  Last visit is about 6 months ago and he is ready to see his cardiologist again in the near future.  More recently, he has been having increasing dyspnea on exertion.  He also describes intermittent chest tightness, however says that is nothing unusual.  CT scan revealed a enlarging left lower lobe lung nodule.  PET scan obtained on 12/16/2020 revealed hypermetabolic small but new nodule left lower lobe nodule consistent with primary bronchogenic  carcinoma until otherwise proven, hypermetabolic activity within the small left paratracheal and a small to mildly enlarged contralateral right paratracheal lymph nodes.  The nodules were too small to be biopsied, therefore patient was referred to the [place] area to be set up with [facility] to consider endobronchial U/S.  Patient got up this morning around 6 AM.  Shortly after, he started having increasing chest pain and shortness of breath.  This prompted him to seek medical attention at [facility].  On arrival, he was noted to be in atrial fibrillation with RVR, EKG also showed a significant ST depression in the inferolateral leads.  BMP was 81.9.  Serial troponin trended up from 170 on arrival to 619 on repeat lab work.  Creatinine was 2.13 although, last creatinine back in 2020 was 0.88.  Hemoglobin 11.5.  Chest x-ray shows no acute issue.  While in the ED, patient self converted back to sinus rhythm.  Given elevation of the troponin, IV heparin was started.  Cardiology was consulted for elevated troponin and also atrial fibrillation with RVR without ischemic changes.  Past Medical History:  Diagnosis Date   Acute respiratory failure (HCC)    CAD (coronary artery disease)    CAP (community acquired pneumonia)    Chronic low back pain    s/p steroid injections   DOE (dyspnea on exertion)    Dyslipidemia    Emphysema lung (HCC)    GERD (gastroesophageal reflux disease)    Gout    Hypercholesteremia    Hypertension    Myocardial ischemia  Past Surgical History:  Procedure Laterality Date   CORONARY ANGIOPLASTY WITH STENT PLACEMENT     x 3      Home Medications:  Prior to Admission medications   Medication Sig Start Date End Date Taking? Authorizing Provider  albuterol (PROVENTIL) (2.5 MG/3ML) 0.083% nebulizer solution Take 2.5 mg by nebulization every 6 (six) hours as needed for wheezing or shortness of breath.   Yes [provider]  albuterol  (VENTOLIN HFA) 108 (90 Base) MCG/ACT inhaler Inhale 2 puffs into the lungs every 6 (six) hours as needed for wheezing or shortness of breath.   Yes [provider]  aspirin EC 81 MG tablet Take 81 mg by mouth daily.   Yes [provider]  cetirizine (ZYRTEC) 10 MG tablet Take 10 mg by mouth at bedtime.   Yes [provider]  clopidogrel (PLAVIX) 75 MG tablet Take 75 mg by mouth daily.   Yes [provider]  ezetimibe (ZETIA) 10 MG tablet Take 5 mg by mouth daily.   Yes [provider]  fluticasone (FLONASE) 50 MCG/ACT nasal spray Place 1-2 sprays into both nostrils daily as needed for allergies or rhinitis.   Yes [provider]  fluticasone-salmeterol (ADVAIR) 250-50 MCG/ACT AEPB Take 1 puff by mouth 2 (two) times daily. Rinse mouth after each use 08/05/20  Yes [provider]  hydrochlorothiazide (HYDRODIURIL) 25 MG tablet Take 12.5 mg by mouth daily. Hold dose for systolic BP less than 756; keep well hydrated. 02/02/20  Yes [provider]  hydroxypropyl methylcellulose / hypromellose (ISOPTO TEARS / GONIOVISC) 2.5 % ophthalmic solution Place 1 drop into both eyes 3 (three) times daily as needed for dry eyes.   Yes [provider]  lisinopril (ZESTRIL) 40 MG tablet Take 40 mg by mouth daily.   Yes [provider]  lovastatin (MEVACOR) 20 MG tablet Take 60 mg by mouth at bedtime.   Yes [provider]  nitroGLYCERIN (NITROSTAT) 0.4 MG SL tablet Place 0.4 mg under the tongue every 5 (five) minutes as needed for chest pain.   Yes [provider]  Omega-3 Fatty Acids (FISH OIL) 1000 MG CAPS Take 2,000 mg by mouth 2 (two) times daily.   Yes [provider]  pantoprazole (PROTONIX) 40 MG tablet Take 1 tablet (40 mg total) by mouth 2 (two) times daily. 04/13/19  Yes Hosie Poisson, MD  sodium chloride (OCEAN) 0.65 % SOLN nasal spray Place 2 sprays into both nostrils as needed for congestion.    Yes [provider]    Inpatient Medications: Scheduled Meds:  metoprolol tartrate  2.5 mg Intravenous Once   Continuous Infusions:  heparin 1,250 Units/hr (01/17/21 0834)   PRN Meds: nitroGLYCERIN  Allergies:    Allergies  Allergen Reactions   Penicillins Shortness Of Breath    Did it involve swelling of the face/tongue/throat, SOB, or low BP? Yes Did it involve sudden or severe rash/hives, skin peeling, or any reaction on the inside of your mouth or nose? No Did you need to seek medical attention at a hospital or doctor's office? No When did it last happen? <10 yrs      If all above answers are "NO", may proceed with cephalosporin use.    Pravastatin Other (See Comments)    Cramp in lower limbs; all statins   Rosuvastatin Other (See Comments)    Muscle pain/cramps; all statins    Social History:   Social History   Socioeconomic History   Marital status: Widowed  Spouse name: Not on file   Number of children: Not on file   Years of education: Not on file   Highest education level: Not on file  Occupational History   Not on file  Tobacco Use   Smoking status: Former    Pack years: 0.00   Smokeless tobacco: Never  Substance and Sexual Activity   Alcohol use: Yes   Drug use: No   Sexual activity: Not on file  Other Topics Concern   Not on file  Social History Narrative   Not on file   Social Determinants of Health   Financial Resource Strain: Not on file  Food Insecurity: Not on file  Transportation Needs: Not on file  Physical Activity: Not on file  Stress: Not on file  Social Connections: Not on file  Intimate Partner Violence: Not on file    Family History:   History reviewed. No pertinent family history.   ROS:  Please see the history of present illness.   All other ROS reviewed and negative.     Physical Exam/Data:   Vitals:   01/17/21 1030 01/17/21 1100 01/17/21 1115 01/17/21 1215  BP: (!) 131/59 (!) 121/52 (!) 146/80 (!)  146/60  Pulse: (!) 54 (!) 51 (!) 52 (!) 52  Resp: 15 14 17 15   Temp:      TempSrc:      SpO2: 99% 97% 100% 100%  Weight:      Height:       No intake or output data in the 24 hours ending 01/17/21 1311 Last 3 Weights 01/17/2021 04/09/2019 10/30/2016  Weight (lbs) 215 lb 215 lb 6.2 oz 220 lb  Weight (kg) 97.523 kg 97.7 kg 99.791 kg     Body mass index is 31.75 kg/m.  General:  Well nourished, well developed, in no acute distress HEENT: normal Lymph: no adenopathy Neck: no JVD Endocrine:  No thryomegaly Vascular: No carotid bruits; FA pulses 2+ bilaterally without bruits  Cardiac:  normal S1, S2; RRR; no murmur  Lungs:  clear to auscultation bilaterally, no wheezing, rhonchi or rales  Abd: soft, nontender, no hepatomegaly  Ext: no edema Musculoskeletal:  No deformities, BUE and BLE strength normal and equal Skin: warm and dry  Neuro:  CNs 2-12 intact, no focal abnormalities noted Psych:  Normal affect   EKG:  The EKG was personally reviewed and demonstrates:  NSR with ST depression in the lateral leads Telemetry:  Telemetry was personally reviewed and demonstrates:  afib with RVR converted to NSR  Relevant CV Studies:  Echo 04/10/2019 IMPRESSIONS     1. Left ventricular ejection fraction, by visual estimation, is 65 to  70%. The left ventricle has normal function. There is no left ventricular  hypertrophy.   2. Left ventricular diastolic Doppler parameters are indeterminate  pattern of LV diastolic filling.   3. Global right ventricle has normal systolic function.The right  ventricular size is normal. No increase in right ventricular wall  thickness.   Laboratory Data:  High Sensitivity Troponin:   Recent Labs  Lab 01/17/21 0753 01/17/21 1037  TROPONINIHS 107* 619*     Chemistry Recent Labs  Lab 01/17/21 0753  NA 134*  K 3.7  CL 102  CO2 21*  GLUCOSE 115*  BUN 32*  CREATININE 2.13*  CALCIUM 9.8  GFRNONAA 32*  ANIONGAP 11    Recent Labs  Lab  01/17/21 0753  PROT 7.0  ALBUMIN 3.6  AST 21  ALT 19  ALKPHOS 89  BILITOT  1.0   Hematology Recent Labs  Lab 01/17/21 0832  WBC 5.8  RBC 3.60*  HGB 11.5*  HCT 35.0*  MCV 97.2  MCH 31.9  MCHC 32.9  RDW 13.1  PLT 254   BNP Recent Labs  Lab 01/17/21 0832  BNP 81.9    DDimer No results for input(s): DDIMER in the last 168 hours.   Radiology/Studies:  DG Chest Port 1 View  Result Date: 01/17/2021 CLINICAL DATA:  Chest pain and shortness of breath EXAM: PORTABLE CHEST 1 VIEW COMPARISON:  04/12/2019 FINDINGS: The heart size and mediastinal contours are within normal limits. Both lungs are clear. The visualized skeletal structures are unremarkable. IMPRESSION: No active disease. Electronically Signed   By: Miachel Roux M.D.   On: 01/17/2021 08:08     Assessment and Plan:   Newly diagnosed atrial fibrillation with RVR  -Previously on carvedilol, however discontinued during previous admission in the September 2020 due to bradycardia.  Current heart rate in the low 50s.  -Heart rate on initial arrival was between 120s to 140s, however he self converted to normal sinus rhythm before IV Lopressor was able to be given.  -CHA2DS2-Vasc score 4 (age 19, CAD, HTN)  -will need anticoagulation therapy.  Rate control will be difficult given bradycardia.  Continue IV heparin, consider switch to oral anticoagulation therapy later.   Chest pain with elevated troponin and ischemic changes on EKG  -Last PCI was back in June 2017 at [facility], this was done after his routine stress test at the [facility] hospital came back abnormal.  He did not have any chest pain back then.  -Cardiac catheterization revealed significant restenosis in the proximal LAD stent that was treated with another drug-eluting stent.  -Patient presented today with chest pain in the setting of A. fib with RVR, initial EKG showed a significant ST depressions in the inferolateral leads.  ST depression still persists in the  lateral leads despite converting back to sinus rhythm.  Initial serial troponin was 170, this trended up to 619 on repeat lab work.  -He will need ischemic work-up, unfortunately his creatinine was up to 2.1 on arrival suggestive of AKI.  Fluid hydration overnight and repeat blood work tomorrow before deciding on further work-up.  Obtain echocardiogram to meantime.  CAD: Last PCI in June 2017.  He has been followed by cardiologist in the [place] system.  Hypertension: on hydrochlorothiazide 25 mg daily and lisinopril 40 mg daily.  His creatinine shot up from 0.88 in 2020 to 2.1 on arrival.  Hyperlipidemia  Acute on CKD stage III: Although records from [place] indicated patient has CKD stage III, previous creatinine was 0.88 in 2020 during last admission, today's creatinine went up to 2.1.  Recently diagnosed lung nodule: PET scan showed mild hypermetabolic activity concerning for primary bronchogenic carcinoma.  Nodule was too small to be biopsied, therefore patient was referred to [facility] pulmonology service to consider endobronchial ultrasound    Risk Assessment/Risk Scores:     TIMI Risk Score for Unstable Angina or Non-ST Elevation MI:   The patient's TIMI risk score is 6, which indicates a 41% risk of all cause mortality, new or recurrent myocardial infarction or need for urgent revascularization in the next 14 days.    CHA2DS2-VASc Score = 4  This indicates a 4.8% annual risk of stroke. The patient's score is based upon: CHF History: No HTN History: Yes Diabetes History: No Stroke History: No Vascular Disease History: Yes Age Score: 2 Gender Score:  0        For questions or updates, please contact [place] Please consult [email or web address] for contact info under    Signed, Almyra Deforest, [place]  01/17/2021 1:11 PM  I have personally seen and examined this patient. I agree with the assessment and plan as outlined above.  He has known CAD. No prior atrial fib. New diagnosis of  probably lung cancer.  Admitted with chest pain and dyspnea.  Found to be in atrial fib with RVR Mild troponin elevation and inferior/anterolateral ST depression.  Creatinine is over 2.0 today. Cannot find recent BMET from the [place] Converted to sinus without intervention. Now on IV heparin. No chest pain or dyspnea at rest.   My exam:  General: Well developed, well nourished, NAD  HEENT: OP clear, mucus membranes moist  SKIN: warm, dry. No rashes. Neuro: No focal deficits  Musculoskeletal: Muscle strength 5/5 all ext  Psychiatric: Mood and affect normal  Neck: No JVD Lungs:Clear bilaterally, no wheezes, rhonci, crackles Cardiovascular: Regular rate and rhythm. No murmurs, gallops or rubs. Abdomen:Soft. Bowel sounds present. Non-tender.  Extremities: No lower extremity edema.   Plan:  New onset atrial fibrillation. Spontaneously converted to sinus. He is on IV heparin. Will need long term anticoagulation once it is clear that no lung biopsy will be needed during this admission and once we have decided on a pathway for ischemic evaluation (stress test vs cardiac cath).  CAD with chest pain/elevated troponin/ischemic EKG changes: The current findings to suggest ischemia are in the setting of atrial fib with RVR. Would continue IV heparin for now. Echo today. Will decide on stress testing vs cardiac cath as we follow renal function over the next 24-48 hours New lung mass: per primary team Acute on chronic renal failure: Per primary team.   Lauree Chandler 01/17/2021 1:40 PM

## 2021-01-17 NOTE — Progress Notes (Signed)
Admission from the ED by bed awake and alert. 

## 2021-01-17 NOTE — Hospital Course (Addendum)
Tony Garcia is 75 y.o. male with a past medical history of hypertension, hyperlipidemia, CAD status post balloon angioplasty and DES to his proximal LAD in 2017, COPD, and a lung nodule concerning for bronchogenic carcinoma. He was admitted for NSTEMI when he presented to the ED with chest pain and shortness of breath.  His hospital course by problem is as follows:  NSTEMI - Demand ischemia in setting of A.fib RVR Patient presented with chest pain and shortness of breath lasting more than 15 minutes. Pain relieved with nitroglycerin and aspirin en-route to the ED. EKG on arrival showed atrial fibrillation with ventricular rate of 111 and ST depression in leads II, III, aVF, V4, and V5.  He was started on IV heparin and admitted for an NSTEMI. High-sensitivity troponin peaked at 1199 then down-trended. He spontaneously converted to sinus rhythm without intervention. Echo showed no wall motion abnormalities, mild LVH and grade 1 diastolic dysfunction. EF of 60-65%.  Heart cath was delayed due to AKI on presentation.  [facility]earth cath completed on 7/7 which showed mild to moderate, non-critical coronary artery disease; Patent overlapping LAD stents with moderate in-stent restenosis (~40%); and mildly elevated left ventricular filling pressure (LVEDP 20-25 mmHg). Cardiology documentation noted that the elevated troponin and chest discomfort in setting of A.fib RVR is consistent with demand ischemia. Given no new significant stenosis on most recent heart cath, cardiology recommends continuation of anticoagulation with a DOAC and aggressive secondary  prevention of coronary artery disease.  Most recent (10/24/20) A1c is 6.3. Blood pressure has mostly been in 007H systolic average.  Patient discharged on a month supply of 5 mg Eliquis. [facility]   AKI on Possible CKD IIIa  Etiology unclear but improving without treatment or intervention.  He likely has  some degree of CKD, however [facility] we do not have access to his records.  Renal function on admission notable for a creatinine of 2.13 with GFR of 32. Renal function improved during admission.    Creatinine of 1.42 and GFR 52 on day of discharge.  Lung nodule: Recently diagnosed, PET scan showed mild hypermetabolic activity concerning for primary bronchogenic carcinoma. He was scheduled to have EBUS the day of admission. Patient would need to call to reschedule that appointment with the [facility] after discharge.

## 2021-01-17 NOTE — ED Triage Notes (Signed)
Presents with chest pain that started around 6am. Receivede asa and nitro with ems with some relief.  Reports pain radiates under arm pits.  Reports this is the same feeling as last heart attack.  Last MI 2007.Diaphoretic + SOB

## 2021-01-17 NOTE — ED Provider Notes (Signed)
[facility] EMERGENCY DEPARTMENT Provider Note   CSN: [id number] Arrival date & time: 01/17/21  0741     History Chief Complaint  Patient presents with   Chest Pain    Tony Garcia is a 75 y.o. male.  The history is provided by the patient, the EMS personnel and medical records.  Chest Pain Tony Garcia is a 75 y.o. male who presents to the Emergency Department complaining of chest pain. He presents the emergency department for evaluation of chest pain that started at 6 AM this morning. Pain is described as tightness and pressure sensation. This occurred at rest. It radiates to his shoulders bilaterally. He has associated shortness of breath. No fevers. He is a cough that is chronic in nature. No nausea, vomiting, diarrhea, abdominal pain, leg swelling or pain. He does have a history of coronary artery disease. He takes Plavix. No history of a fib. He has experienced similar episodes in the past but they normally only last a few minutes in this episode has been persistent and severe in nature. He did receive 324 of aspirin as well as three nitroglycerin's by EMS. He did report improvement in his pain with nitroglycerin.    Past Medical History:  Diagnosis Date   Acute respiratory failure (HCC)    CAD (coronary artery disease)    CAP (community acquired pneumonia)    Chronic low back pain    s/p steroid injections   DOE (dyspnea on exertion)    Dyslipidemia    Emphysema lung (HCC)    GERD (gastroesophageal reflux disease)    Gout    Hypercholesteremia    Hypertension    Myocardial ischemia     Patient Active Problem List   Diagnosis Date Noted   NSTEMI (non-ST elevated myocardial infarction) ([place]) 01/17/2021   Bradycardia 04/09/2019   Essential hypertension 04/09/2019   Acute respiratory failure ([place]) 04/09/2019   Cough with hemoptysis 04/09/2019   Acute respiratory failure with hypoxia ([place]) 04/09/2019   Myocardial ischemia    Emphysema lung  (HCC)    CAD (coronary artery disease)    Dyslipidemia    Chronic low back pain    CAP (community acquired pneumonia) 04/08/2019    Past Surgical History:  Procedure Laterality Date   CHOLECYSTECTOMY     CORONARY ANGIOPLASTY WITH STENT PLACEMENT     x 3    REPLACEMENT TOTAL KNEE BILATERAL         History reviewed. No pertinent family history.  Social History   Tobacco Use   Smoking status: Former    Packs/day: 2.00    Years: 30.00    Pack years: 60.00    Types: Cigarettes   Smokeless tobacco: Never  Substance Use Topics   Alcohol use: Yes   Drug use: No    Home Medications Prior to Admission medications   Medication Sig Start Date End Date Taking? Authorizing Provider  albuterol (PROVENTIL) (2.5 MG/3ML) 0.083% nebulizer solution Take 2.5 mg by nebulization every 6 (six) hours as needed for wheezing or shortness of breath.   Yes [provider]  albuterol (VENTOLIN HFA) 108 (90 Base) MCG/ACT inhaler Inhale 2 puffs into the lungs every 6 (six) hours as needed for wheezing or shortness of breath.   Yes [provider]  aspirin EC 81 MG tablet Take 81 mg by mouth daily.   Yes [provider]  cetirizine (ZYRTEC) 10 MG tablet Take 10 mg by mouth at bedtime.   Yes [provider]  clopidogrel (PLAVIX) 75 MG tablet Take 75 mg by mouth daily.   Yes [provider]  ezetimibe (ZETIA) 10 MG tablet Take 5 mg by mouth daily.   Yes [provider]  fluticasone (FLONASE) 50 MCG/ACT nasal spray Place 1-2 sprays into both nostrils daily as needed for allergies or rhinitis.   Yes [provider]  fluticasone-salmeterol (ADVAIR) 250-50 MCG/ACT AEPB Take 1 puff by mouth 2 (two) times daily. Rinse mouth after each use 08/05/20  Yes [provider]  hydrochlorothiazide (HYDRODIURIL) 25 MG tablet Take 12.5 mg by mouth daily. Hold dose for systolic BP less than 536; keep well hydrated. 02/02/20  Yes [provider]   hydroxypropyl methylcellulose / hypromellose (ISOPTO TEARS / GONIOVISC) 2.5 % ophthalmic solution Place 1 drop into both eyes 3 (three) times daily as needed for dry eyes.   Yes [provider]  lisinopril (ZESTRIL) 40 MG tablet Take 40 mg by mouth daily.   Yes [provider]  lovastatin (MEVACOR) 20 MG tablet Take 60 mg by mouth at bedtime.   Yes [provider]  nitroGLYCERIN (NITROSTAT) 0.4 MG SL tablet Place 0.4 mg under the tongue every 5 (five) minutes as needed for chest pain.   Yes [provider]  Omega-3 Fatty Acids (FISH OIL) 1000 MG CAPS Take 2,000 mg by mouth 2 (two) times daily.   Yes [provider]  pantoprazole (PROTONIX) 40 MG tablet Take 1 tablet (40 mg total) by mouth 2 (two) times daily. 04/13/19  Yes Hosie Poisson, MD  sodium chloride (OCEAN) 0.65 % SOLN nasal spray Place 2 sprays into both nostrils as needed for congestion.   Yes [provider]    Allergies    Penicillins, Pravastatin, and Rosuvastatin  Review of Systems   Review of Systems  Cardiovascular:  Positive for chest pain.  All other systems reviewed and are negative.  Physical Exam Updated Vital Signs BP (!) 177/81   Pulse (!) 51   Temp 97.7 F (36.5 C) (Oral)   Resp 16   Ht 5\' 9"  (1.753 m)   Wt 97.5 kg   SpO2 99%   BMI 31.75 kg/m   Physical Exam Vitals and nursing note reviewed.  Constitutional:      Appearance: He is well-developed.  HENT:     Head: Normocephalic and atraumatic.  Cardiovascular:     Rate and Rhythm: Tachycardia present. Rhythm irregular.     Heart sounds: No murmur heard. Pulmonary:     Effort: Pulmonary effort is normal. No respiratory distress.     Breath sounds: Normal breath sounds.  Abdominal:     Palpations: Abdomen is soft.     Tenderness: There is no abdominal tenderness. There is no guarding or rebound.  Musculoskeletal:        General: No tenderness.     Comments: Nonpitting edema to bilateral lower  extremities  Skin:    General: Skin is warm and dry.  Neurological:     Mental Status: He is alert and oriented to person, place, and time.  Psychiatric:        Behavior: Behavior normal.    ED Results / Procedures / Treatments   Labs (all labs ordered are listed, but only abnormal results are displayed) Labs Reviewed  COMPREHENSIVE METABOLIC PANEL - Abnormal; Notable for the following components:      Result Value   Sodium 134 (*)    CO2 21 (*)    Glucose, Bld 115 (*)  BUN 32 (*)    Creatinine, Ser 2.13 (*)    GFR, Estimated 32 (*)    All other components within normal limits  HEPARIN LEVEL (UNFRACTIONATED) - Abnormal; Notable for the following components:   Heparin Unfractionated <0.10 (*)    All other components within normal limits  CBC WITH DIFFERENTIAL/PLATELET - Abnormal; Notable for the following components:   RBC 3.60 (*)    Hemoglobin 11.5 (*)    HCT 35.0 (*)    All other components within normal limits  APTT - Abnormal; Notable for the following components:   aPTT 90 (*)    All other components within normal limits  TROPONIN I (HIGH SENSITIVITY) - Abnormal; Notable for the following components:   Troponin I (High Sensitivity) 107 (*)    All other components within normal limits  TROPONIN I (HIGH SENSITIVITY) - Abnormal; Notable for the following components:   Troponin I (High Sensitivity) 619 (*)    All other components within normal limits  TROPONIN I (HIGH SENSITIVITY) - Abnormal; Notable for the following components:   Troponin I (High Sensitivity) 1,199 (*)    All other components within normal limits  RESP PANEL BY RT-PCR (FLU A&B, COVID) ARPGX2  PROTIME-INR  BRAIN NATRIURETIC PEPTIDE  HEPARIN LEVEL (UNFRACTIONATED)  PROTIME-INR  CBC WITH DIFFERENTIAL/PLATELET  TROPONIN I (HIGH SENSITIVITY)    EKG EKG Interpretation  Date/Time:  Tuesday January 17 2021 08:08:05 EDT Ventricular Rate:  55 PR Interval:  53 QRS Duration: 98 QT Interval:  414 QTC  Calculation: 396 R Axis:   68 Text Interpretation: Sinus rhythm Atrial premature complex Short PR interval Anterior infarct, old Borderline ST depression, diffuse leads Confirmed by Quintella Reichert [phone]) on 01/17/2021 8:13:12 AM  Radiology DG Chest Port 1 View  Result Date: 01/17/2021 CLINICAL DATA:  Chest pain and shortness of breath EXAM: PORTABLE CHEST 1 VIEW COMPARISON:  04/12/2019 FINDINGS: The heart size and mediastinal contours are within normal limits. Both lungs are clear. The visualized skeletal structures are unremarkable. IMPRESSION: No active disease. Electronically Signed   By: Miachel Roux M.D.   On: 01/17/2021 08:08    Procedures Procedures  CRITICAL CARE Performed by: Quintella Reichert   Total critical care time: 40 minutes  Critical care time was exclusive of separately billable procedures and treating other patients.  Critical care was necessary to treat or prevent imminent or life-threatening deterioration.  Critical care was time spent personally by me on the following activities: development of treatment plan with patient and/or surrogate as well as nursing, discussions with consultants, evaluation of patient's response to treatment, examination of patient, obtaining history from patient or surrogate, ordering and performing treatments and interventions, ordering and review of laboratory studies, ordering and review of radiographic studies, pulse oximetry and re-evaluation of patient's condition.  Medications Ordered in ED Medications  metoprolol tartrate (LOPRESSOR) injection 2.5 mg (2.5 mg Intravenous Not Given 01/17/21 0806)  heparin ADULT infusion 100 units/mL (25000 units/263mL) (1,250 Units/hr Intravenous New Bag/Given 01/17/21 0834)  nitroGLYCERIN (NITROSTAT) SL tablet 0.4 mg (0.4 mg Sublingual Given 01/17/21 0845)  ezetimibe (ZETIA) tablet 5 mg (has no administration in time range)  pravastatin (PRAVACHOL) tablet 20 mg (has no administration in time range)   pantoprazole (PROTONIX) EC tablet 40 mg (has no administration in time range)  albuterol (PROVENTIL) (2.5 MG/3ML) 0.083% nebulizer solution 2.5 mg (has no administration in time range)  fluticasone furoate-vilanterol (BREO ELLIPTA) 200-25 MCG/INH 1 puff (1 puff Inhalation Not Given 01/17/21 1506)  polyvinyl alcohol (LIQUIFILM TEARS)  1.4 % ophthalmic solution 1 drop (has no administration in time range)  acetaminophen (TYLENOL) tablet 650 mg (has no administration in time range)  ondansetron (ZOFRAN) injection 4 mg (has no administration in time range)  heparin bolus via infusion 4,000 Units (4,000 Units Intravenous Bolus from Bag 01/17/21 0835)    ED Course  I have reviewed the triage vital signs and the nursing notes.  Pertinent labs & imaging results that were available during my care of the patient were reviewed by me and considered in my medical decision making (see chart for details).    MDM Rules/Calculators/A&P                          patient with history of coronary artery disease here for evaluation of episode of chest pain that started this morning. EKG with new onset a fib with RVR with acute ischemic changes. Patient did spontaneously convert to sinus rhythm. Patient with ongoing chest tightness after conversion to sinus rhythm. His chest tightness did improve after nitroglycerin administration. Troponin is mildly elevated at 107. He does have CKD and creatinine is at two, which is similar to his baseline when records reviewed in care everywhere. He had a creatinine of 1.8 in April of this year at the [place]. He was treated with heparin for ACS. Cardiology consulted for further management. Medicine consulted for admission. Final Clinical Impression(s) / ED Diagnoses Final diagnoses:  Atrial fibrillation, rapid [facility])  NSTEMI (non-ST elevated myocardial infarction) [facility])    Rx / DC Orders ED Discharge Orders     None        Quintella Reichert, MD 01/17/21 1524

## 2021-01-17 NOTE — ED Notes (Signed)
Dr Court Joy notified of patients elevated troponin no new orders at this time

## 2021-01-17 NOTE — Progress Notes (Addendum)
ANTICOAGULATION CONSULT NOTE - Follow Up Consult  Pharmacy Consult for heparin Indication: chest pain/ACS  Allergies  Allergen Reactions   Penicillins Shortness Of Breath    Did it involve swelling of the face/tongue/throat, SOB, or low BP? Yes Did it involve sudden or severe rash/hives, skin peeling, or any reaction on the inside of your mouth or nose? No Did you need to seek medical attention at a hospital or doctor's office? No When did it last happen? <10 yrs      If all above answers are "NO", may proceed with cephalosporin use.    Pravastatin Other (See Comments)    Cramp in lower limbs; all statins   Rosuvastatin Other (See Comments)    Muscle pain/cramps; all statins    Patient Measurements: Height: 5\' 9"  (175.3 cm) Weight: 94.9 kg (209 lb 3.5 oz) IBW/kg (Calculated) : 70.7 Heparin Dosing Weight: 91.1 kg  Vital Signs: Temp: 98.1 F (36.7 C) (07/05 1730) Temp Source: Oral (07/05 1730) BP: 149/72 (07/05 1730) Pulse Rate: 55 (07/05 1730)  Medical History: Past Medical History:  Diagnosis Date   Acute respiratory failure (HCC)    CAD (coronary artery disease)    CAP (community acquired pneumonia)    Chronic low back pain    s/p steroid injections   DOE (dyspnea on exertion)    Dyslipidemia    Emphysema lung (HCC)    GERD (gastroesophageal reflux disease)    Gout    Hypercholesteremia    Hypertension    Myocardial ischemia    Assessment: 75 yo M with CP that started at 0600 this AM. Radiates to shoulders bilaterally. No SOB, fevers or N/V/D. Hx of CAD, on Plavix. No hx of Afib or taking AC. S/p ASA 324mg  + 3 SL NTG with EMS. Pain improved with NTG. Patient also now with new Afib w/ RVR. CHAD2VASc score of 4.   Heparin level of 0.31 is therapeutic on heparin 1250 units/hr. Previous heparin level of 0.49 was obtained approximately 2 hrs after heparin bolus.   Goal of Therapy:  Heparin level 0.3-0.7 units/ml Monitor platelets by anticoagulation protocol: Yes    Plan:  Increase heparin to 1300 units/hr to ensure remains in therapeutic range Check 6 hr heparin level  Monitor heparin level, CBC and s/s of bleeding    Cristela Felt, PharmD Clinical Pharmacist

## 2021-01-17 NOTE — Progress Notes (Signed)
Still waiting for dinner tray, followed up with dietary by secretary.

## 2021-01-17 NOTE — Progress Notes (Signed)
ANTICOAGULATION CONSULT NOTE - Initial Consult  Pharmacy Consult for heparin Indication: chest pain/ACS  Allergies  Allergen Reactions   Penicillins Shortness Of Breath    Did it involve swelling of the face/tongue/throat, SOB, or low BP? Yes Did it involve sudden or severe rash/hives, skin peeling, or any reaction on the inside of your mouth or nose? No Did you need to seek medical attention at a hospital or doctor's office? No When did it last happen? <10 yrs      If all above answers are "NO", may proceed with cephalosporin use.     Patient Measurements: Height: 5\' 9"  (175.3 cm) Weight: 97.5 kg (215 lb) IBW/kg (Calculated) : 70.7 Heparin Dosing Weight: 91.1 kg  Vital Signs: Temp: 97.7 F (36.5 C) (07/05 0746) Temp Source: Oral (07/05 0746) BP: 141/83 (07/05 0800) Pulse Rate: 10 (07/05 0805)  Medical History: Past Medical History:  Diagnosis Date   Acute respiratory failure (HCC)    CAD (coronary artery disease)    CAP (community acquired pneumonia)    Chronic low back pain    s/p steroid injections   DOE (dyspnea on exertion)    Dyslipidemia    Emphysema lung (HCC)    GERD (gastroesophageal reflux disease)    Gout    Hypercholesteremia    Hypertension    Myocardial ischemia    Assessment: 75 yo M with CP that started at 0600 this AM. Radiates to shoulders bilaterally. No SOB, fevers or N/V/D. Hx of CAD, on Plavix. No hx of Afib or taking AC. S/p ASA 324mg  + 3 SL NTG with EMS. Pain improved with NTG.  Goal of Therapy:  Heparin level 0.3-0.7 units/ml Monitor platelets by anticoagulation protocol: Yes   Plan:  Give 4000 units bolus x 1 Start heparin infusion at 1250 units/hr -Monitor daily HL, CBC, and any s/sx of bleeding -F/u HL @ 1630 on 7/5  Joetta Manners, PharmD, [facility] Emergency Medicine Clinical Pharmacist ED RPh Phone: [place]: [phone]

## 2021-01-18 ENCOUNTER — Other Ambulatory Visit (HOSPITAL_COMMUNITY): Payer: Self-pay

## 2021-01-18 DIAGNOSIS — I480 Paroxysmal atrial fibrillation: Secondary | ICD-10-CM

## 2021-01-18 DIAGNOSIS — I4891 Unspecified atrial fibrillation: Secondary | ICD-10-CM | POA: Diagnosis present

## 2021-01-18 LAB — CBC
HCT: 34 % — ABNORMAL LOW (ref 39.0–52.0)
Hemoglobin: 11.4 g/dL — ABNORMAL LOW (ref 13.0–17.0)
MCH: 32.3 pg (ref 26.0–34.0)
MCHC: 33.5 g/dL (ref 30.0–36.0)
MCV: 96.3 fL (ref 80.0–100.0)
Platelets: 245 10*3/uL (ref 150–400)
RBC: 3.53 MIL/uL — ABNORMAL LOW (ref 4.22–5.81)
RDW: 13.2 % (ref 11.5–15.5)
WBC: 5.5 10*3/uL (ref 4.0–10.5)
nRBC: 0 % (ref 0.0–0.2)

## 2021-01-18 LAB — PHOSPHORUS: Phosphorus: 3.2 mg/dL (ref 2.5–4.6)

## 2021-01-18 LAB — COMPREHENSIVE METABOLIC PANEL
ALT: 16 U/L (ref 0–44)
AST: 23 U/L (ref 15–41)
Albumin: 3.2 g/dL — ABNORMAL LOW (ref 3.5–5.0)
Alkaline Phosphatase: 74 U/L (ref 38–126)
Anion gap: 6 (ref 5–15)
BUN: 25 mg/dL — ABNORMAL HIGH (ref 8–23)
CO2: 24 mmol/L (ref 22–32)
Calcium: 9.3 mg/dL (ref 8.9–10.3)
Chloride: 105 mmol/L (ref 98–111)
Creatinine, Ser: 1.45 mg/dL — ABNORMAL HIGH (ref 0.61–1.24)
GFR, Estimated: 50 mL/min — ABNORMAL LOW (ref >60)
Glucose, Bld: 101 mg/dL — ABNORMAL HIGH (ref 70–99)
Potassium: 4.1 mmol/L (ref 3.5–5.1)
Sodium: 135 mmol/L (ref 135–145)
Total Bilirubin: 0.7 mg/dL (ref 0.3–1.2)
Total Protein: 6.4 g/dL — ABNORMAL LOW (ref 6.5–8.1)

## 2021-01-18 LAB — HEPARIN LEVEL (UNFRACTIONATED)
Heparin Unfractionated: 0.22 IU/mL — ABNORMAL LOW (ref 0.30–0.70)
Heparin Unfractionated: 0.36 IU/mL (ref 0.30–0.70)

## 2021-01-18 LAB — MAGNESIUM: Magnesium: 1.6 mg/dL — ABNORMAL LOW (ref 1.7–2.4)

## 2021-01-18 MED ORDER — MAGNESIUM SULFATE 2 GM/50ML IV SOLN
2.0000 g | Freq: Once | INTRAVENOUS | Status: AC
  Administered 2021-01-18: 2 g via INTRAVENOUS
  Filled 2021-01-18: qty 50

## 2021-01-18 MED ORDER — SODIUM CHLORIDE 0.9% FLUSH: 3.0000 mL | INTRAVENOUS | Status: DC | PRN

## 2021-01-18 MED ORDER — SODIUM CHLORIDE 0.9 % WEIGHT BASED INFUSION
3.0000 mL/kg/h | INTRAVENOUS | Status: DC
  Administered 2021-01-19: 3 mL/kg/h via INTRAVENOUS

## 2021-01-18 MED ORDER — SODIUM CHLORIDE 0.9 % IV SOLN: INTRAVENOUS | Status: AC

## 2021-01-18 MED ORDER — ASPIRIN 81 MG PO CHEW
81.0000 mg | CHEWABLE_TABLET | ORAL | Status: AC
  Administered 2021-01-19: 81 mg via ORAL
  Filled 2021-01-18: qty 1

## 2021-01-18 MED ORDER — SODIUM CHLORIDE 0.9 % WEIGHT BASED INFUSION
1.0000 mL/kg/h | INTRAVENOUS | Status: DC
  Administered 2021-01-19: 1 mL/kg/h via INTRAVENOUS

## 2021-01-18 MED ORDER — SODIUM CHLORIDE 0.9% FLUSH
3.0000 mL | Freq: Two times a day (BID) | INTRAVENOUS | Status: DC
  Administered 2021-01-18: 3 mL via INTRAVENOUS

## 2021-01-18 MED ORDER — SODIUM CHLORIDE 0.9 % IV SOLN: 250.0000 mL | INTRAVENOUS | Status: DC | PRN

## 2021-01-18 NOTE — Progress Notes (Signed)
[place] for heparin Indication: chest pain/ACS  Allergies  Allergen Reactions   Penicillins Shortness Of Breath    Did it involve swelling of the face/tongue/throat, SOB, or low BP? Yes Did it involve sudden or severe rash/hives, skin peeling, or any reaction on the inside of your mouth or nose? No Did you need to seek medical attention at a hospital or doctor's office? No When did it last happen? <10 yrs      If all above answers are "NO", may proceed with cephalosporin use.    Pravastatin Other (See Comments)    Cramp in lower limbs; all statins   Rosuvastatin Other (See Comments)    Muscle pain/cramps; all statins    Patient Measurements: Height: 5\' 9"  (175.3 cm) Weight: 94.9 kg (209 lb 3.5 oz) IBW/kg (Calculated) : 70.7 Heparin Dosing Weight: 91.1 kg  Vital Signs: Temp: 97.7 F (36.5 C) (07/06 0300) Temp Source: Oral (07/06 0300) BP: 148/70 (07/06 0300) Pulse Rate: 49 (07/06 0300)  Medical History: Past Medical History:  Diagnosis Date   Acute respiratory failure (HCC)    CAD (coronary artery disease)    CAP (community acquired pneumonia)    Chronic low back pain    s/p steroid injections   DOE (dyspnea on exertion)    Dyslipidemia    Emphysema lung (HCC)    GERD (gastroesophageal reflux disease)    Gout    Hypercholesteremia    Hypertension    Myocardial ischemia    Assessment: 75 y.o. male with chest pain and Afib for heparin  Goal of Therapy:  Heparin level 0.3-0.7 units/ml Monitor platelets by anticoagulation protocol: Yes   Plan:  Increase Heparin  1500 units/hr Check heparin level in 8 hours.  Phillis Knack, PharmD, BCPS

## 2021-01-18 NOTE — Progress Notes (Signed)
Progress Note  Patient Name: Tony Garcia Date of Encounter: 01/18/2021  [place] Cardiologist: J[facility])  Subjective   No chest pain this am. No dyspnea.   Inpatient Medications    Scheduled Meds:  aspirin EC  81 mg Oral Daily   ezetimibe  5 mg Oral Daily   fluticasone furoate-vilanterol  1 puff Inhalation Daily   metoprolol tartrate  2.5 mg Intravenous Once   pantoprazole  40 mg Oral BID   pravastatin  20 mg Oral q1800   Continuous Infusions:  heparin 1,500 Units/hr (01/18/21 0528)   magnesium sulfate bolus IVPB     PRN Meds: acetaminophen, albuterol, nitroGLYCERIN, ondansetron (ZOFRAN) IV, polyvinyl alcohol   Vital Signs    Vitals:   01/17/21 1925 01/17/21 2300 01/18/21 0300 01/18/21 0751  BP: (!) 159/73 97/62 (!) 148/70 109/66  Pulse: (!) 57 64 (!) 49 (!) 54  Resp: 16 18 18 16   Temp: 98.1 F (36.7 C) 98 F (36.7 C) 97.7 F (36.5 C) 97.7 F (36.5 C)  TempSrc: Oral Oral Oral Oral  SpO2: 99% 96% 97% 98%  Weight:      Height:        Intake/Output Summary (Last 24 hours) at 01/18/2021 0940 Last data filed at 01/18/2021 3762 Gross per 24 hour  Intake 484.18 ml  Output --  Net 484.18 ml   Last 3 Weights 01/17/2021 01/17/2021 04/09/2019  Weight (lbs) 209 lb 3.5 oz 215 lb 215 lb 6.2 oz  Weight (kg) 94.9 kg 97.523 kg 97.7 kg      Telemetry    Sinus brady - Personally Reviewed  ECG    Sinus brady, ST depression resolved- Personally Reviewed  Physical Exam   GEN: No acute distress.   Neck: No JVD Cardiac: RRR, no murmurs, rubs, or gallops.  Respiratory: Clear to auscultation bilaterally. GI: Soft, nontender, non-distended  MS: No edema; No deformity. Neuro:  Nonfocal  Psych: Normal affect   Labs    High Sensitivity Troponin:   Recent Labs  Lab 01/17/21 0753 01/17/21 1037 01/17/21 1331 01/17/21 1835 01/17/21 2218  TROPONINIHS 107* 619* 1,199* 1,026* 994*      Chemistry Recent Labs  Lab 01/17/21 0753  01/17/21 1835 01/18/21 0621  NA 134* 135 135  K 3.7 4.4 4.1  CL 102 103 105  CO2 21* 22 24  GLUCOSE 115* 130* 101*  BUN 32* 26* 25*  CREATININE 2.13* 1.60* 1.45*  CALCIUM 9.8 9.7 9.3  PROT 7.0  --  6.4*  ALBUMIN 3.6  --  3.2*  AST 21  --  23  ALT 19  --  16  ALKPHOS 89  --  74  BILITOT 1.0  --  0.7  GFRNONAA 32* 45* 50*  ANIONGAP 11 10 6      Hematology Recent Labs  Lab 01/17/21 0832 01/18/21 0205  WBC 5.8 5.5  RBC 3.60* 3.53*  HGB 11.5* 11.4*  HCT 35.0* 34.0*  MCV 97.2 96.3  MCH 31.9 32.3  MCHC 32.9 33.5  RDW 13.1 13.2  PLT 254 245    BNP Recent Labs  Lab 01/17/21 0832  BNP 81.9     DDimer No results for input(s): DDIMER in the last 168 hours.   Radiology    DG Chest Port 1 View  Result Date: 01/17/2021 CLINICAL DATA:  Chest pain and shortness of breath EXAM: PORTABLE CHEST 1 VIEW COMPARISON:  04/12/2019 FINDINGS: The heart size and mediastinal contours are within normal limits. Both lungs are  clear. The visualized skeletal structures are unremarkable. IMPRESSION: No active disease. Electronically Signed   By: Miachel Roux M.D.   On: 01/17/2021 08:08   ECHOCARDIOGRAM COMPLETE  Result Date: 01/17/2021    ECHOCARDIOGRAM REPORT   Patient Name:   Tony Garcia Date of Exam: 01/17/2021 Medical Rec #:  [id number]        Height:       69.0 in Accession #:    [id number]       Weight:       215.0 lb Date of Birth:  [birth year]         BSA:          2.130 m Patient Age:    75 years         BP:           83/73 mmHg Patient Gender: M                HR:           52 bpm. Exam Location:  Inpatient Procedure: 2D Echo, Cardiac Doppler and Color Doppler Indications:    R07.9* Chest pain, unspecified  History:        Patient has prior history of Echocardiogram examinations, most                 recent 04/10/2019. CAD; Risk Factors:Dyslipidemia and                 Hypertension.  Sonographer:    Bernadene Person RDCS Referring Phys: Tse Bonito  1. Left ventricular  ejection fraction, by estimation, is 60 to 65%. The left ventricle has normal function. The left ventricle has no regional wall motion abnormalities. There is mild left ventricular hypertrophy. Left ventricular diastolic parameters are consistent with Grade I diastolic dysfunction (impaired relaxation).  2. Right ventricular systolic function is normal. The right ventricular size is normal. There is normal pulmonary artery systolic pressure.  3. The mitral valve is grossly normal. No evidence of mitral valve regurgitation. No evidence of mitral stenosis. Moderate mitral annular calcification.  4. The aortic valve is tricuspid. There is mild thickening of the aortic valve. Aortic valve regurgitation is not visualized. No aortic stenosis is present.  5. The inferior vena cava is normal in size with greater than 50% respiratory variability, suggesting right atrial pressure of 3 mmHg. Comparison(s): A prior study was performed on 04/10/2019. No significant change from prior study. FINDINGS  Left Ventricle: Left ventricular ejection fraction, by estimation, is 60 to 65%. The left ventricle has normal function. The left ventricle has no regional wall motion abnormalities. The left ventricular internal cavity size was normal in size. There is  mild left ventricular hypertrophy. Left ventricular diastolic parameters are consistent with Grade I diastolic dysfunction (impaired relaxation). Right Ventricle: The right ventricular size is normal. No increase in right ventricular wall thickness. Right ventricular systolic function is normal. There is normal pulmonary artery systolic pressure. Left Atrium: Left atrial size was normal in size. Right Atrium: Right atrial size was normal in size. Pericardium: There is no evidence of pericardial effusion. Mitral Valve: The mitral valve is grossly normal. Moderate mitral annular calcification. No evidence of mitral valve regurgitation. No evidence of mitral valve stenosis. Tricuspid  Valve: The tricuspid valve is normal in structure. Tricuspid valve regurgitation is not demonstrated. No evidence of tricuspid stenosis. Aortic Valve: The aortic valve is tricuspid. There is mild thickening of the aortic valve. Aortic valve regurgitation is not visualized.  No aortic stenosis is present. Pulmonic Valve: The pulmonic valve was not well visualized. Pulmonic valve regurgitation is not visualized. No evidence of pulmonic stenosis. Aorta: The aortic root and ascending aorta are structurally normal, with no evidence of dilitation. Venous: The inferior vena cava is normal in size with greater than 50% respiratory variability, suggesting right atrial pressure of 3 mmHg. IAS/Shunts: The atrial septum is grossly normal.  LEFT VENTRICLE PLAX 2D LVIDd:         4.60 cm  Diastology LVIDs:         2.60 cm  LV e' medial:    5.29 cm/s LV PW:         1.00 cm  LV E/e' medial:  18.9 LV IVS:        1.10 cm  LV e' lateral:   8.73 cm/s LVOT diam:     2.00 cm  LV E/e' lateral: 11.5 LV SV:         95 LV SV Index:   45 LVOT Area:     3.14 cm  RIGHT VENTRICLE RV S prime:     12.20 cm/s TAPSE (M-mode): 2.0 cm LEFT ATRIUM             Index       RIGHT ATRIUM           Index LA diam:        3.70 cm 1.74 cm/m  RA Area:     14.00 cm LA Vol (A2C):   39.2 ml 18.40 ml/m RA Volume:   31.20 ml  14.64 ml/m LA Vol (A4C):   36.9 ml 17.32 ml/m LA Biplane Vol: 38.9 ml 18.26 ml/m  AORTIC VALVE LVOT Vmax:   124.00 cm/s LVOT Vmean:  78.300 cm/s LVOT VTI:    0.302 m  AORTA Ao Root diam: 3.80 cm Ao Asc diam:  3.10 cm MITRAL VALVE MV Area (PHT): 3.42 cm     SHUNTS MV Decel Time: 222 msec     Systemic VTI:  0.30 m MV E velocity: 100.00 cm/s  Systemic Diam: 2.00 cm MV A velocity: 94.30 cm/s MV E/A ratio:  1.06 Rudean Haskell MD Electronically signed by Rudean Haskell MD Signature Date/Time: 01/17/2021/5:25:33 PM    Final     Cardiac Studies     Patient Profile     75 y.o. male with history of CAD, HTN, HLD, carotid artery  disease, CKD and recently diagnosed lung nodule who was admitted with atrial fib with RVR. Converted to sinus in the ED. Troponin peak 1199 and now trending down.   Assessment & Plan    Elevated troponin: Unclear if this is demand ischemia from atrial fib with RVR or true ischemia. His EKG changes yesterday were impressive even after he was sinus rhythm. I think cardiac cath is indicated. He is known to have CAD. LV function is normal with no wall motion abnormalities. Given renal failure, will plan to hydrate today and plan cardiac cath tomorrow 01/19/21. He can eat today. NPO after midnight  Atrial fib with RVR: Sinus this am. He will need long term oral anti-coagulation once all invasive testing is completed.   3.  Lung mass: Likely lung cancer. Pulmonary consult pending.   For questions or updates, please contact [place] Please consult [email or web address] for contact info under        Signed, Lauree Chandler, MD  01/18/2021, 9:40 AM

## 2021-01-18 NOTE — Progress Notes (Signed)
ANTICOAGULATION CONSULT NOTE - Follow Up Consult  Pharmacy Consult for heparin Indication: chest pain/ACS  Allergies  Allergen Reactions   Penicillins Shortness Of Breath    Did it involve swelling of the face/tongue/throat, SOB, or low BP? Yes Did it involve sudden or severe rash/hives, skin peeling, or any reaction on the inside of your mouth or nose? No Did you need to seek medical attention at a hospital or doctor's office? No When did it last happen? <10 yrs      If all above answers are "NO", may proceed with cephalosporin use.    Pravastatin Other (See Comments)    Cramp in lower limbs; all statins   Rosuvastatin Other (See Comments)    Muscle pain/cramps; all statins    Patient Measurements: Height: 5\' 9"  (175.3 cm) Weight: 94.9 kg (209 lb 3.5 oz) IBW/kg (Calculated) : 70.7 Heparin Dosing Weight: 91.1 kg  Vital Signs: Temp: 97.8 F (36.6 C) (07/06 1133) Temp Source: Oral (07/06 1133) BP: 101/70 (07/06 1133) Pulse Rate: 48 (07/06 1133)  Medical History: Past Medical History:  Diagnosis Date   Acute respiratory failure (HCC)    CAD (coronary artery disease)    CAP (community acquired pneumonia)    Chronic low back pain    s/p steroid injections   DOE (dyspnea on exertion)    Dyslipidemia    Emphysema lung (HCC)    GERD (gastroesophageal reflux disease)    Gout    Hypercholesteremia    Hypertension    Myocardial ischemia    Assessment: 75 yo M with CP that started at 0600 this AM. Radiates to shoulders bilaterally. No SOB, fevers or N/V/D. Hx of CAD, on Plavix. No hx of Afib or taking AC. S/p ASA 324mg  + 3 SL NTG with EMS. Pain improved with NTG. Patient also now with new Afib w/ RVR. CHAD2VASc score of 4.   Heparin level of 0.36 is therapeutic on heparin 1500 units/hr. Hgb 11.4, plt 245. No s/sx of bleeding or infusion issues. Plan for cath 7/7.  Goal of Therapy:  Heparin level 0.3-0.7 units/ml Monitor platelets by anticoagulation protocol: Yes   Plan:   Continue heparin infusion at 1500 units/hr Monitor heparin level, CBC and s/s of bleeding    Antonietta Jewel, PharmD, BCCCP Clinical Pharmacist  Phone: [phone] 01/18/2021 12:30 PM  Please check AMION for all [place] phone numbers After 10:00 PM, call [place] [phone]

## 2021-01-18 NOTE — TOC Benefit Eligibility Note (Signed)
Patient Teacher, English as a foreign language completed.    The patient is currently admitted and upon discharge could be taking Eliquis 5 mg.  The current 30 day co-pay is, $312.00 due to a $265.00 deductible.  Should be $47.00 a month after this.   The patient is insured through [place], [place] Patient Advocate Specialist [place] Team Direct Number: [phone]  Fax: [phone]

## 2021-01-18 NOTE — Progress Notes (Signed)
Subjective: Overnight Events: No acute overnight events  Patient says he is feeling better and currently has no chest pain or shortness of breath. Says the chest pain that brought him to the ED yesterday was different from pain felt during past MI.  Wants to get out of bed if possible. Says the cardiology team came to update him on the plan for today. Objective: Vital signs in last 24 hours: Vitals:   01/17/21 1925 01/17/21 2300 01/18/21 0300 01/18/21 0751  BP: (!) 159/73 97/62 (!) 148/70 109/66  Pulse: (!) 57 64 (!) 49 (!) 54  Resp: 16 18 18 16   Temp: 98.1 F (36.7 C) 98 F (36.7 C) 97.7 F (36.5 C) 97.7 F (36.5 C)  TempSrc: Oral Oral Oral Oral  SpO2: 99% 96% 97% 98%  Weight:      Height:       Lab Results: Lab 01/17/21 0753 01/17/21 1037 01/17/21 1331 01/17/21 1835 01/17/21 2218  TROPONIN I HS 107* 619* 1,199* 1,026* 994*   CBC Latest Ref Rng & Units 01/18/2021 01/17/2021 04/12/2019  WBC 4.0 - 10.5 K/uL 5.5 5.8 10.9(H)  Hemoglobin 13.0 - 17.0 g/dL 11.4(L) 11.5(L) 12.4(L)  Hematocrit 39.0 - 52.0 % 34.0(L) 35.0(L) 37.9(L)  Platelets 150 - 400 K/uL 245 254 538(H)    CMP Latest Ref Rng & Units 01/18/2021 01/17/2021 01/17/2021  Glucose 70 - 99 mg/dL 101(H) 130(H) 115(H)  BUN 8 - 23 mg/dL 25(H) 26(H) 32(H)  Creatinine 0.61 - 1.24 mg/dL 1.45(H) 1.60(H) 2.13(H)  Sodium 135 - 145 mmol/L 135 135 134(L)  Potassium 3.5 - 5.1 mmol/L 4.1 4.4 3.7  Chloride 98 - 111 mmol/L 105 103 102  CO2 22 - 32 mmol/L 24 22 21(L)  Calcium 8.9 - 10.3 mg/dL 9.3 9.7 9.8  Total Protein 6.5 - 8.1 g/dL 6.4(L) - 7.0  Total Bilirubin 0.3 - 1.2 mg/dL 0.7 - 1.0  Alkaline Phos 38 - 126 U/L 74 - 89  AST 15 - 41 U/L 23 - 21  ALT 0 - 44 U/L 16 - 19     Weight change:   Intake/Output Summary (Last 24 hours) at 01/18/2021 1121 Last data filed at 01/18/2021 0728 Gross per 24 hour  Intake 484.18 ml  Output --  Net 484.18 ml    Studies/Results:  Echo:  LV ejection fraction of 60-65%. No wall motion  abnormalities. EKG: Sinus bradycardia, ST-depression resolved  Medications: Scheduled Meds:  aspirin EC  81 mg Oral Daily   ezetimibe  5 mg Oral Daily   fluticasone furoate-vilanterol  1 puff Inhalation Daily   metoprolol tartrate  2.5 mg Intravenous Once   pantoprazole  40 mg Oral BID   pravastatin  20 mg Oral q1800   Continuous Infusions:  sodium chloride     heparin 1,500 Units/hr (01/18/21 0528)   magnesium sulfate bolus IVPB     PRN Meds:.acetaminophen, albuterol, nitroGLYCERIN, ondansetron (ZOFRAN) IV, polyvinyl alcohol  Assessment/Plan: Tony Garcia is 75 y.o. male with history of CAD, HTN, HLD and recently diagnosed lung nodule who was admitted for chest pain and shortness of breath found and to have NSTEMI. Currently undergoing medical optimization for heart cath tomorrow 01/19/21.  Active Problems:   NSTEMI (non-ST elevated myocardial infarction) ([place]) NSTEMI Patient's chest pain has resolved. Troponin peaked to 1199 and now down trending. ECG with sinus bradycardia (consistent with known history) There has been interval improvement in creatinine and GFR. Cardiology team following patient, appreciate their involvement.  -continue heparin drip -Metoprolol 2.5 mg -Pravastatin  20 mg daily - Cardiac catheterization 01/19/21  -NPO after midnight  New A.fib with RVR- resolved Patient found to be in A.fib with rapid ventricular response (rate of 140) on presentation to ED but spontaneously converted to sinus rhythm.  No A.fib recurrence captured on telemetry so far. May need long term anticoagulation if A.fib recurs. CHA2DS2-Vasc score 4 (age 79, CAD, HTN). Continue to monitor  AKI on CKD - improved Unable to establish clear renal function baseline. Available data in [place] indicate baseline creatine range 0.86-1.1. Review of record from [facility] with the [place] system show creatinine of 1.8.  Creatinine of 2.13 with GFR of 32 on presentation, now improved with   creatinine of 1.45 and GFR of 50. Scheduled for heart cath tomorrow given improving renal function.  -Continue PO intake as tolerated,  -Avoid nephrotoxic drugs  Lung nodule: Recently diagnosed, PET scan showed mild hypermetabolic activity concerning for primary bronchogenic carcinoma.  Nodule was too small to be biopsied, therefore patient was referred to pulmonology service to consider endobronchial ultrasound.  -F/u outpatient   This is a Careers information officer Note.  The care of the patient was discussed with Dr. Velna Ochs, MD and the assessment and plan formulated with their assistance.  Please see their attached note for official documentation of the daily encounter.   LOS: 1 day   AsempahCatalina Pizza, Medical Student 01/18/2021, 11:21 AM

## 2021-01-19 ENCOUNTER — Encounter (HOSPITAL_COMMUNITY)
Admission: EM | Disposition: A | Discharge status: Home / Self Care | Payer: Self-pay | Source: Home / Self Care | Attending: Internal Medicine

## 2021-01-19 ENCOUNTER — Encounter (HOSPITAL_COMMUNITY): Payer: Self-pay | Admitting: Internal Medicine

## 2021-01-19 DIAGNOSIS — I2510 Atherosclerotic heart disease of native coronary artery without angina pectoris: Secondary | ICD-10-CM

## 2021-01-19 HISTORY — PX: LEFT HEART CATH AND CORONARY ANGIOGRAPHY: CATH118249

## 2021-01-19 LAB — LIPID PANEL
Cholesterol: 177 mg/dL (ref 0–200)
HDL: 31 mg/dL — ABNORMAL LOW (ref >40)
LDL Cholesterol: 81 mg/dL (ref 0–99)
Total CHOL/HDL Ratio: 5.7 RATIO
Triglycerides: 327 mg/dL — ABNORMAL HIGH (ref <150)
VLDL: 65 mg/dL — ABNORMAL HIGH (ref 0–40)

## 2021-01-19 LAB — CBC
HCT: 34.3 % — ABNORMAL LOW (ref 39.0–52.0)
Hemoglobin: 11.4 g/dL — ABNORMAL LOW (ref 13.0–17.0)
MCH: 31.3 pg (ref 26.0–34.0)
MCHC: 33.2 g/dL (ref 30.0–36.0)
MCV: 94.2 fL (ref 80.0–100.0)
Platelets: 269 10*3/uL (ref 150–400)
RBC: 3.64 MIL/uL — ABNORMAL LOW (ref 4.22–5.81)
RDW: 13.1 % (ref 11.5–15.5)
WBC: 6.8 10*3/uL (ref 4.0–10.5)
nRBC: 0 % (ref 0.0–0.2)

## 2021-01-19 LAB — BASIC METABOLIC PANEL
Anion gap: 8 (ref 5–15)
BUN: 25 mg/dL — ABNORMAL HIGH (ref 8–23)
CO2: 23 mmol/L (ref 22–32)
Calcium: 9.1 mg/dL (ref 8.9–10.3)
Chloride: 103 mmol/L (ref 98–111)
Creatinine, Ser: 1.44 mg/dL — ABNORMAL HIGH (ref 0.61–1.24)
GFR, Estimated: 51 mL/min — ABNORMAL LOW (ref >60)
Glucose, Bld: 104 mg/dL — ABNORMAL HIGH (ref 70–99)
Potassium: 4.4 mmol/L (ref 3.5–5.1)
Sodium: 134 mmol/L — ABNORMAL LOW (ref 135–145)

## 2021-01-19 LAB — MAGNESIUM: Magnesium: 2.1 mg/dL (ref 1.7–2.4)

## 2021-01-19 LAB — MRSA NEXT GEN BY PCR, NASAL: MRSA by PCR Next Gen: NOT DETECTED

## 2021-01-19 LAB — HEPARIN LEVEL (UNFRACTIONATED): Heparin Unfractionated: 0.52 IU/mL (ref 0.30–0.70)

## 2021-01-19 SURGERY — LEFT HEART CATH AND CORONARY ANGIOGRAPHY
Anesthesia: LOCAL

## 2021-01-19 MED ORDER — VERAPAMIL HCL 2.5 MG/ML IV SOLN
INTRAVENOUS | Status: DC | PRN
  Administered 2021-01-19: 10 mL via INTRA_ARTERIAL

## 2021-01-19 MED ORDER — HEPARIN (PORCINE) 25000 UT/250ML-% IV SOLN
1500.0000 [IU]/h | INTRAVENOUS | Status: DC
  Administered 2021-01-19: 1500 [IU]/h via INTRAVENOUS
  Filled 2021-01-19: qty 250

## 2021-01-19 MED ORDER — VERAPAMIL HCL 2.5 MG/ML IV SOLN
INTRAVENOUS | Status: AC
  Filled 2021-01-19: qty 2

## 2021-01-19 MED ORDER — FENTANYL CITRATE (PF) 100 MCG/2ML IJ SOLN
INTRAMUSCULAR | Status: DC | PRN
  Administered 2021-01-19: 25 ug via INTRAVENOUS

## 2021-01-19 MED ORDER — SODIUM CHLORIDE 0.9% FLUSH: 3.0000 mL | INTRAVENOUS | Status: DC | PRN

## 2021-01-19 MED ORDER — SODIUM CHLORIDE 0.9% FLUSH
3.0000 mL | Freq: Two times a day (BID) | INTRAVENOUS | Status: DC
  Administered 2021-01-19 – 2021-01-20 (×2): 3 mL via INTRAVENOUS

## 2021-01-19 MED ORDER — HEPARIN SODIUM (PORCINE) 1000 UNIT/ML IJ SOLN
INTRAMUSCULAR | Status: DC | PRN
  Administered 2021-01-19: 5000 [IU] via INTRAVENOUS

## 2021-01-19 MED ORDER — SODIUM CHLORIDE 0.9 % IV SOLN: INTRAVENOUS | Status: AC

## 2021-01-19 MED ORDER — FENTANYL CITRATE (PF) 100 MCG/2ML IJ SOLN
INTRAMUSCULAR | Status: AC
  Filled 2021-01-19: qty 2

## 2021-01-19 MED ORDER — LIDOCAINE HCL (PF) 1 % IJ SOLN
INTRAMUSCULAR | Status: AC
  Filled 2021-01-19: qty 30

## 2021-01-19 MED ORDER — IOHEXOL 350 MG/ML SOLN
INTRAVENOUS | Status: DC | PRN
  Administered 2021-01-19: 38 mL

## 2021-01-19 MED ORDER — HYDRALAZINE HCL 20 MG/ML IJ SOLN: 10.0000 mg | INTRAMUSCULAR | Status: AC | PRN

## 2021-01-19 MED ORDER — SODIUM CHLORIDE 0.9 % IV SOLN: 250.0000 mL | INTRAVENOUS | Status: DC | PRN

## 2021-01-19 MED ORDER — HEPARIN SODIUM (PORCINE) 1000 UNIT/ML IJ SOLN
INTRAMUSCULAR | Status: AC
  Filled 2021-01-19: qty 1

## 2021-01-19 MED ORDER — HEPARIN (PORCINE) IN NACL 1000-0.9 UT/500ML-% IV SOLN
INTRAVENOUS | Status: AC
  Filled 2021-01-19: qty 1000

## 2021-01-19 MED ORDER — LIDOCAINE HCL (PF) 1 % IJ SOLN
INTRAMUSCULAR | Status: DC | PRN
  Administered 2021-01-19: 2 mL

## 2021-01-19 MED ORDER — MIDAZOLAM HCL 2 MG/2ML IJ SOLN
INTRAMUSCULAR | Status: AC
  Filled 2021-01-19: qty 2

## 2021-01-19 MED ORDER — HEPARIN (PORCINE) IN NACL 1000-0.9 UT/500ML-% IV SOLN
INTRAVENOUS | Status: DC | PRN
  Administered 2021-01-19 (×2): 500 mL

## 2021-01-19 MED ORDER — MIDAZOLAM HCL 2 MG/2ML IJ SOLN
INTRAMUSCULAR | Status: DC | PRN
  Administered 2021-01-19: 1 mg via INTRAVENOUS

## 2021-01-19 SURGICAL SUPPLY — 9 items
CATH OPTITORQUE TIG 4.0 5F (CATHETERS) ×2 IMPLANT
DEVICE RAD COMP TR BAND LRG (VASCULAR PRODUCTS) ×2 IMPLANT
GLIDESHEATH SLEND SS 6F .021 (SHEATH) ×2 IMPLANT
KIT HEART LEFT (KITS) ×2 IMPLANT
PACK CARDIAC CATHETERIZATION (CUSTOM PROCEDURE TRAY) ×2 IMPLANT
TRANSDUCER W/STOPCOCK (MISCELLANEOUS) ×2 IMPLANT
TUBING CIL FLEX 10 FLL-RA (TUBING) ×2 IMPLANT
WIRE EMERALD 3MM-J .035X150CM (WIRE) ×2 IMPLANT
WIRE HI TORQ VERSACORE-J 145CM (WIRE) ×2 IMPLANT

## 2021-01-19 NOTE — Progress Notes (Signed)
Central Tele notified RN that pt's HR dropped down to 39.  Pt has been SB in the 40s since returning from cath lab and was SB in upper 40s/ low 50s. Mancel Bale, NP notified. Pt HR currently 42.  Pt is sleeping but easy to rouse.RN will continue to monitor.

## 2021-01-19 NOTE — Plan of Care (Signed)

## 2021-01-19 NOTE — Progress Notes (Signed)
Subjective: Overnight Events: No acute overnight events  Patient  says he is feeling well, no chest pain, sob , or heart palpitation overnight.  Says he did not get enough sleep overnight and has been up since 5 am with people "poking" at him all the time. When asked about his bradycardia, he reports no symptoms of dizziness with getting out of bed.  having bowel movements and adequate urination. Looking forward to going home and have a beer.  Objective: Vital signs in last 24 hours: Vitals:   01/19/21 0945 01/19/21 1003 01/19/21 1107 01/19/21 1138  BP: (!) 141/69 125/77 137/64   Pulse: (!) 48 (!) 49 (!) 43 (!) 53  Resp: 15 16 13 13   Temp:  97.7 F (36.5 C)    TempSrc:  Oral    SpO2: 99%  98% 99%  Weight:      Height:       Lab Results: Cardiac Panel (last 3 results) Recent Labs    01/17/21 1331 01/17/21 1835 01/17/21 2218  TROPONINIHS 1,199* 1,026* 994*     CBC Latest Ref Rng & Units 01/19/2021 01/18/2021 01/17/2021  WBC 4.0 - 10.5 K/uL 6.8 5.5 5.8  Hemoglobin 13.0 - 17.0 g/dL 11.4(L) 11.4(L) 11.5(L)  Hematocrit 39.0 - 52.0 % 34.3(L) 34.0(L) 35.0(L)  Platelets 150 - 400 K/uL 269 245 254    CMP Latest Ref Rng & Units 01/19/2021 01/18/2021 01/17/2021  Glucose 70 - 99 mg/dL 104(H) 101(H) 130(H)  BUN 8 - 23 mg/dL 25(H) 25(H) 26(H)  Creatinine 0.61 - 1.24 mg/dL 1.44(H) 1.45(H) 1.60(H)  Sodium 135 - 145 mmol/L 134(L) 135 135  Potassium 3.5 - 5.1 mmol/L 4.4 4.1 4.4  Chloride 98 - 111 mmol/L 103 105 103  CO2 22 - 32 mmol/L 23 24 22   Calcium 8.9 - 10.3 mg/dL 9.1 9.3 9.7  Total Protein 6.5 - 8.1 g/dL - 6.4(L) -  Total Bilirubin 0.3 - 1.2 mg/dL - 0.7 -  Alkaline Phos 38 - 126 U/L - 74 -  AST 15 - 41 U/L - 23 -  ALT 0 - 44 U/L - 16 -     Weight change:   Intake/Output Summary (Last 24 hours) at 01/19/2021 1322 Last data filed at 01/19/2021 1146 Gross per 24 hour  Intake 1257.95 ml  Output 1225 ml  Net 32.95 ml    Studies/Results:  Left Heart Cath:  Mild to moderate,  non-critical coronary artery disease. Patent overlapping LAD stents with moderate in-stent restenosis (~40%). Mildly elevated left ventricular filling pressure (LVEDP 20-25 mmHg).  Echo:  LV ejection fraction of 60-65%. No wall motion abnormalities. EKG: Sinus bradycardia, ST-depression resolved  Medications: Scheduled Meds:  aspirin EC  81 mg Oral Daily   ezetimibe  5 mg Oral Daily   fluticasone furoate-vilanterol  1 puff Inhalation Daily   pantoprazole  40 mg Oral BID   pravastatin  20 mg Oral q1800   sodium chloride flush  3 mL Intravenous Q12H   Continuous Infusions:  sodium chloride     sodium chloride     PRN Meds:.sodium chloride, acetaminophen, albuterol, hydrALAZINE, nitroGLYCERIN, ondansetron (ZOFRAN) IV, polyvinyl alcohol, sodium chloride flush  Assessment/Plan: Tony Garcia is 75 y.o. male with a past medical history of hypertension, hyperlipidemia, CAD status post balloon angioplasty and DES to his proximal LAD in 2017, COPD, and a lung nodule concerning for bronchogenic carcinoma who was admitted for NSTEMI. He is under medical monitoring, particularly renal function s/p heart catheterization and expected to be discharged tomorrow 7/8  Principal Problem:   NSTEMI (non-ST elevated myocardial infarction) [facility]) Active Problems:   Atrial fibrillation, rapid ([place]) NSTEMI, due to demand ischemia S/p hear catheterization 7/7 which showed  mild to moderate, non-critical coronary artery disease; patent overlapping LAD stents with moderate in-stent restenosis (~40%) and mildly elevated left ventricular filling pressure (LVEDP 20-25 mmHg). In light of the heart cath results, the elevated troponin and concomitant chest pain and shortness of breath in setting of A.fib with rapid ventricular response, his NSTEMI is likely due to demand ischemia. No chest pain or shortness of breath. Cardiology recommend aggressive secondary prevention, which would include lifestyle modification and  optimization of his HTN and anti-lipid medications.Most recent A1c is 6.3.  Appreciate cards involvement.  -Restart IV heparin 2 hours after removal of TR band.  Consider transitioning to [place] as soon as tomorrow morning if there is no evidence of bleeding or vascular injury -Pravastatin 20 mg daily -Ezetimibe 5 mg  PO daily -Consider restarting home HTN medication.  New A.fib with RVR- resolved Patient found to be in A.fib with rapid ventricular response on presentation to ED but spontaneously converted to sinus rhythm.  Cardiology cath suspects the A.fib RVR may have have caused in demand ischemia leading to NSTEMI.  No A.fib recurrence captured on telemetry so far. CHA2DS2-Vasc score 4 (age 28, CAD, HTN). Continue to monitor  AKI on CKD - Resolving  Etiology unclear but improving without treatment or intervention.  He likely has some degree of CKD, however [facility] not have [facility] to [facility]. Creatinine of 1.44 and GFR 51 today. Monitoring renal function s/p heart cath. -Continue PO intake as tolerated,  -Avoid nephrotoxic drugs -Am BMP   Lung nodule Recently diagnosed, PET scan showed mild hypermetabolic activity concerning for primary bronchogenic carcinoma. Patient was scheduled to have EBUS the day of admission. -F/u outpatient, patient may need to call the [place] to reschedule this appointment.    This is a Careers information officer Note.  The care of the patient was discussed with Dr. Velna Ochs, MD and the assessment and plan formulated with their assistance.  Please see their attached note for official documentation of the daily encounter.   LOS: 2 days   Jadence Kinlaw, Catalina Pizza, Medical Student 01/19/2021, 1:22 PM

## 2021-01-19 NOTE — H&P (View-Only) (Signed)
Progress Note  Patient Name: Tony Garcia Date of Encounter: 01/19/2021  [place] Cardiologist: [facility])  Subjective   No chest pain or dyspnea overnight.   Inpatient Medications    Scheduled Meds:  aspirin EC  81 mg Oral Daily   ezetimibe  5 mg Oral Daily   fluticasone furoate-vilanterol  1 puff Inhalation Daily   metoprolol tartrate  2.5 mg Intravenous Once   pantoprazole  40 mg Oral BID   pravastatin  20 mg Oral q1800   sodium chloride flush  3 mL Intravenous Q12H   Continuous Infusions:  sodium chloride     sodium chloride 1 mL/kg/hr (01/19/21 0503)   heparin 1,500 Units/hr (01/19/21 0100)   PRN Meds: sodium chloride, acetaminophen, albuterol, nitroGLYCERIN, ondansetron (ZOFRAN) IV, polyvinyl alcohol, sodium chloride flush   Vital Signs    Vitals:   01/18/21 2128 01/18/21 2300 01/19/21 0300 01/19/21 0400  BP:  (!) 150/75 (!) 154/72   Pulse: (!) 52 (!) 49 (!) 49 (!) 47  Resp: 14 (!) 23 (!) 22 17  Temp:  98 F (36.7 C) 97.8 F (36.6 C)   TempSrc:  Oral Oral   SpO2: 100% 100% 98% 96%  Weight:      Height:        Intake/Output Summary (Last 24 hours) at 01/19/2021 0735 Last data filed at 01/19/2021 0300 Gross per 24 hour  Intake 1097.95 ml  Output 1200 ml  Net -102.05 ml   Last 3 Weights 01/17/2021 01/17/2021 04/09/2019  Weight (lbs) 209 lb 3.5 oz 215 lb 215 lb 6.2 oz  Weight (kg) 94.9 kg 97.523 kg 97.7 kg      Telemetry    Sinus - Personally Reviewed  ECG    No AM EKG  Physical Exam    General: Well developed, well nourished, NAD  HEENT: OP clear, mucus membranes moist  SKIN: warm, dry.  Neuro: No focal deficits  Musculoskeletal: Muscle strength 5/5 all ext  Psychiatric: Mood and affect normal  Neck: No JVD Lungs:Clear bilaterally, no wheezes, rhonci, crackles Cardiovascular: Regular rate and rhythm. No murmurs, gallops or rubs. Abdomen:Soft. BS present Extremities: No lower extremity edema.    Labs    High  Sensitivity Troponin:   Recent Labs  Lab 01/17/21 0753 01/17/21 1037 01/17/21 1331 01/17/21 1835 01/17/21 2218  TROPONINIHS 107* 619* 1,199* 1,026* 994*      Chemistry Recent Labs  Lab 01/17/21 0753 01/17/21 1835 01/18/21 0621 01/19/21 0036  NA 134* 135 135 134*  K 3.7 4.4 4.1 4.4  CL 102 103 105 103  CO2 21* 22 24 23   GLUCOSE 115* 130* 101* 104*  BUN 32* 26* 25* 25*  CREATININE 2.13* 1.60* 1.45* 1.44*  CALCIUM 9.8 9.7 9.3 9.1  PROT 7.0  --  6.4*  --   ALBUMIN 3.6  --  3.2*  --   AST 21  --  23  --   ALT 19  --  16  --   ALKPHOS 89  --  74  --   BILITOT 1.0  --  0.7  --   GFRNONAA 32* 45* 50* 51*  ANIONGAP 11 10 6 8      Hematology Recent Labs  Lab 01/17/21 0832 01/18/21 0205 01/19/21 0036  WBC 5.8 5.5 6.8  RBC 3.60* 3.53* 3.64*  HGB 11.5* 11.4* 11.4*  HCT 35.0* 34.0* 34.3*  MCV 97.2 96.3 94.2  MCH 31.9 32.3 31.3  MCHC 32.9 33.5 33.2  RDW 13.1 13.2  13.1  PLT 254 245 269    BNP Recent Labs  Lab 01/17/21 0832  BNP 81.9     DDimer No results for input(s): DDIMER in the last 168 hours.   Radiology    DG Chest Port 1 View  Result Date: 01/17/2021 CLINICAL DATA:  Chest pain and shortness of breath EXAM: PORTABLE CHEST 1 VIEW COMPARISON:  04/12/2019 FINDINGS: The heart size and mediastinal contours are within normal limits. Both lungs are clear. The visualized skeletal structures are unremarkable. IMPRESSION: No active disease. Electronically Signed   By: Miachel Roux M.D.   On: 01/17/2021 08:08   ECHOCARDIOGRAM COMPLETE  Result Date: 01/17/2021    ECHOCARDIOGRAM REPORT   Patient Name:   Tony Garcia Date of Exam: 01/17/2021 Medical Rec #:  [id number]        Height:       69.0 in Accession #:    [id number]       Weight:       215.0 lb Date of Birth:  1946-04-28         BSA:          2.130 m Patient Age:    75 years         BP:           83/73 mmHg Patient Gender: M                HR:           52 bpm. Exam Location:  Inpatient Procedure: 2D Echo, Cardiac  Doppler and Color Doppler Indications:    R07.9* Chest pain, unspecified  History:        Patient has prior history of Echocardiogram examinations, most                 recent 04/10/2019. CAD; Risk Factors:Dyslipidemia and                 Hypertension.  Sonographer:    Bernadene Person RDCS Referring Phys: [place]  1. Left ventricular ejection fraction, by estimation, is 60 to 65%. The left ventricle has normal function. The left ventricle has no regional wall motion abnormalities. There is mild left ventricular hypertrophy. Left ventricular diastolic parameters are consistent with Grade I diastolic dysfunction (impaired relaxation).  2. Right ventricular systolic function is normal. The right ventricular size is normal. There is normal pulmonary artery systolic pressure.  3. The mitral valve is grossly normal. No evidence of mitral valve regurgitation. No evidence of mitral stenosis. Moderate mitral annular calcification.  4. The aortic valve is tricuspid. There is mild thickening of the aortic valve. Aortic valve regurgitation is not visualized. No aortic stenosis is present.  5. The inferior vena cava is normal in size with greater than 50% respiratory variability, suggesting right atrial pressure of 3 mmHg. Comparison(s): A prior study was performed on 04/10/2019. No significant change from prior study. FINDINGS  Left Ventricle: Left ventricular ejection fraction, by estimation, is 60 to 65%. The left ventricle has normal function. The left ventricle has no regional wall motion abnormalities. The left ventricular internal cavity size was normal in size. There is  mild left ventricular hypertrophy. Left ventricular diastolic parameters are consistent with Grade I diastolic dysfunction (impaired relaxation). Right Ventricle: The right ventricular size is normal. No increase in right ventricular wall thickness. Right ventricular systolic function is normal. There is normal pulmonary artery  systolic pressure. Left Atrium: Left atrial size was normal in size. Right Atrium:  Right atrial size was normal in size. Pericardium: There is no evidence of pericardial effusion. Mitral Valve: The mitral valve is grossly normal. Moderate mitral annular calcification. No evidence of mitral valve regurgitation. No evidence of mitral valve stenosis. Tricuspid Valve: The tricuspid valve is normal in structure. Tricuspid valve regurgitation is not demonstrated. No evidence of tricuspid stenosis. Aortic Valve: The aortic valve is tricuspid. There is mild thickening of the aortic valve. Aortic valve regurgitation is not visualized. No aortic stenosis is present. Pulmonic Valve: The pulmonic valve was not well visualized. Pulmonic valve regurgitation is not visualized. No evidence of pulmonic stenosis. Aorta: The aortic root and ascending aorta are structurally normal, with no evidence of dilitation. Venous: The inferior vena cava is normal in size with greater than 50% respiratory variability, suggesting right atrial pressure of 3 mmHg. IAS/Shunts: The atrial septum is grossly normal.  LEFT VENTRICLE PLAX 2D LVIDd:         4.60 cm  Diastology LVIDs:         2.60 cm  LV e' medial:    5.29 cm/s LV PW:         1.00 cm  LV E/e' medial:  18.9 LV IVS:        1.10 cm  LV e' lateral:   8.73 cm/s LVOT diam:     2.00 cm  LV E/e' lateral: 11.5 LV SV:         95 LV SV Index:   45 LVOT Area:     3.14 cm  RIGHT VENTRICLE RV S prime:     12.20 cm/s TAPSE (M-mode): 2.0 cm LEFT ATRIUM             Index       RIGHT ATRIUM           Index LA diam:        3.70 cm 1.74 cm/m  RA Area:     14.00 cm LA Vol (A2C):   39.2 ml 18.40 ml/m RA Volume:   31.20 ml  14.64 ml/m LA Vol (A4C):   36.9 ml 17.32 ml/m LA Biplane Vol: 38.9 ml 18.26 ml/m  AORTIC VALVE LVOT Vmax:   124.00 cm/s LVOT Vmean:  78.300 cm/s LVOT VTI:    0.302 m  AORTA Ao Root diam: 3.80 cm Ao Asc diam:  3.10 cm MITRAL VALVE MV Area (PHT): 3.42 cm     SHUNTS MV Decel Time: 222  msec     Systemic VTI:  0.30 m MV E velocity: 100.00 cm/s  Systemic Diam: 2.00 cm MV A velocity: 94.30 cm/s MV E/A ratio:  1.06 Rudean Haskell MD Electronically signed by Rudean Haskell MD Signature Date/Time: 01/17/2021/5:25:33 PM    Final     Cardiac Studies     Patient Profile     75 y.o. male with history of CAD, HTN, HLD, carotid artery disease, CKD and recently diagnosed lung nodule who was admitted with atrial fib with RVR. Converted to sinus in the ED. Troponin peak 1199 and now trending down.   Assessment & Plan    Elevated troponin: Unclear if this is demand ischemia from atrial fib with RVR or true ischemia. His EKG changes on the day of admission were impressive even after he was sinus rhythm. Echo with normal LV systolic funciton. I think cardiac cath is indicated. He is known to have CAD. Cardiac cath today.   Atrial fib with RVR: He is in sinus rhythm today. He will need long term oral  anti-coagulation once all invasive testing is completed.   3.  Lung mass: Likely lung cancer. Outpatient pulmonary consult pending.   For questions or updates, please contact [place] Please consult [email or web address] for contact info under        Signed, Lauree Chandler, MD  01/19/2021, 7:35 AM

## 2021-01-19 NOTE — Interval H&P Note (Signed)
History and Physical Interval Note:  01/19/2021 9:19 AM  Tony Garcia  has presented today for surgery, with the diagnosis of NSTEMI.  The various methods of treatment have been discussed with the patient and family. After consideration of risks, benefits and other options for treatment, the patient has consented to  Procedure(s): LEFT HEART CATH AND CORONARY ANGIOGRAPHY (N/A) as a surgical intervention.  The patient's history has been reviewed, patient examined, no change in status, stable for surgery.  I have reviewed the patient's chart and labs.  Questions were answered to the patient's satisfaction.    Cath Lab Visit (complete for each Cath Lab visit)  Clinical Evaluation Leading to the Procedure:   ACS: Yes.    Non-ACS:  N/A  Timi Reeser

## 2021-01-19 NOTE — Progress Notes (Signed)
Progress Note  Patient Name: Tony Garcia Date of Encounter: 01/19/2021  [place] Cardiologist: [facility])  Subjective   No chest pain or dyspnea overnight.   Inpatient Medications    Scheduled Meds:  aspirin EC  81 mg Oral Daily   ezetimibe  5 mg Oral Daily   fluticasone furoate-vilanterol  1 puff Inhalation Daily   metoprolol tartrate  2.5 mg Intravenous Once   pantoprazole  40 mg Oral BID   pravastatin  20 mg Oral q1800   sodium chloride flush  3 mL Intravenous Q12H   Continuous Infusions:  sodium chloride     sodium chloride 1 mL/kg/hr (01/19/21 0503)   heparin 1,500 Units/hr (01/19/21 0100)   PRN Meds: sodium chloride, acetaminophen, albuterol, nitroGLYCERIN, ondansetron (ZOFRAN) IV, polyvinyl alcohol, sodium chloride flush   Vital Signs    Vitals:   01/18/21 2128 01/18/21 2300 01/19/21 0300 01/19/21 0400  BP:  (!) 150/75 (!) 154/72   Pulse: (!) 52 (!) 49 (!) 49 (!) 47  Resp: 14 (!) 23 (!) 22 17  Temp:  98 F (36.7 C) 97.8 F (36.6 C)   TempSrc:  Oral Oral   SpO2: 100% 100% 98% 96%  Weight:      Height:        Intake/Output Summary (Last 24 hours) at 01/19/2021 0735 Last data filed at 01/19/2021 0300 Gross per 24 hour  Intake 1097.95 ml  Output 1200 ml  Net -102.05 ml   Last 3 Weights 01/17/2021 01/17/2021 04/09/2019  Weight (lbs) 209 lb 3.5 oz 215 lb 215 lb 6.2 oz  Weight (kg) 94.9 kg 97.523 kg 97.7 kg      Telemetry    Sinus - Personally Reviewed  ECG    No AM EKG  Physical Exam    General: Well developed, well nourished, NAD  HEENT: OP clear, mucus membranes moist  SKIN: warm, dry.  Neuro: No focal deficits  Musculoskeletal: Muscle strength 5/5 all ext  Psychiatric: Mood and affect normal  Neck: No JVD Lungs:Clear bilaterally, no wheezes, rhonci, crackles Cardiovascular: Regular rate and rhythm. No murmurs, gallops or rubs. Abdomen:Soft. BS present Extremities: No lower extremity edema.    Labs    High  Sensitivity Troponin:   Recent Labs  Lab 01/17/21 0753 01/17/21 1037 01/17/21 1331 01/17/21 1835 01/17/21 2218  TROPONINIHS 107* 619* 1,199* 1,026* 994*      Chemistry Recent Labs  Lab 01/17/21 0753 01/17/21 1835 01/18/21 0621 01/19/21 0036  NA 134* 135 135 134*  K 3.7 4.4 4.1 4.4  CL 102 103 105 103  CO2 21* 22 24 23   GLUCOSE 115* 130* 101* 104*  BUN 32* 26* 25* 25*  CREATININE 2.13* 1.60* 1.45* 1.44*  CALCIUM 9.8 9.7 9.3 9.1  PROT 7.0  --  6.4*  --   ALBUMIN 3.6  --  3.2*  --   AST 21  --  23  --   ALT 19  --  16  --   ALKPHOS 89  --  74  --   BILITOT 1.0  --  0.7  --   GFRNONAA 32* 45* 50* 51*  ANIONGAP 11 10 6 8      Hematology Recent Labs  Lab 01/17/21 0832 01/18/21 0205 01/19/21 0036  WBC 5.8 5.5 6.8  RBC 3.60* 3.53* 3.64*  HGB 11.5* 11.4* 11.4*  HCT 35.0* 34.0* 34.3*  MCV 97.2 96.3 94.2  MCH 31.9 32.3 31.3  MCHC 32.9 33.5 33.2  RDW 13.1 13.2  13.1  PLT 254 245 269    BNP Recent Labs  Lab 01/17/21 0832  BNP 81.9     DDimer No results for input(s): DDIMER in the last 168 hours.   Radiology    DG Chest Port 1 View  Result Date: 01/17/2021 CLINICAL DATA:  Chest pain and shortness of breath EXAM: PORTABLE CHEST 1 VIEW COMPARISON:  04/12/2019 FINDINGS: The heart size and mediastinal contours are within normal limits. Both lungs are clear. The visualized skeletal structures are unremarkable. IMPRESSION: No active disease. Electronically Signed   By: Miachel Roux M.D.   On: 01/17/2021 08:08   ECHOCARDIOGRAM COMPLETE  Result Date: 01/17/2021    ECHOCARDIOGRAM REPORT   Patient Name:   Tony Garcia Date of Exam: 01/17/2021 Medical Rec #:  [id number]        Height:       69.0 in Accession #:    [id number]       Weight:       215.0 lb Date of Birth:  05-22-1946         BSA:          2.130 m Patient Age:    75 years         BP:           83/73 mmHg Patient Gender: M                HR:           52 bpm. Exam Location:  Inpatient Procedure: 2D Echo, Cardiac  Doppler and Color Doppler Indications:    R07.9* Chest pain, unspecified  History:        Patient has prior history of Echocardiogram examinations, most                 recent 04/10/2019. CAD; Risk Factors:Dyslipidemia and                 Hypertension.  Sonographer:    Bernadene Person RDCS Referring Phys: [place]  1. Left ventricular ejection fraction, by estimation, is 60 to 65%. The left ventricle has normal function. The left ventricle has no regional wall motion abnormalities. There is mild left ventricular hypertrophy. Left ventricular diastolic parameters are consistent with Grade I diastolic dysfunction (impaired relaxation).  2. Right ventricular systolic function is normal. The right ventricular size is normal. There is normal pulmonary artery systolic pressure.  3. The mitral valve is grossly normal. No evidence of mitral valve regurgitation. No evidence of mitral stenosis. Moderate mitral annular calcification.  4. The aortic valve is tricuspid. There is mild thickening of the aortic valve. Aortic valve regurgitation is not visualized. No aortic stenosis is present.  5. The inferior vena cava is normal in size with greater than 50% respiratory variability, suggesting right atrial pressure of 3 mmHg. Comparison(s): A prior study was performed on 04/10/2019. No significant change from prior study. FINDINGS  Left Ventricle: Left ventricular ejection fraction, by estimation, is 60 to 65%. The left ventricle has normal function. The left ventricle has no regional wall motion abnormalities. The left ventricular internal cavity size was normal in size. There is  mild left ventricular hypertrophy. Left ventricular diastolic parameters are consistent with Grade I diastolic dysfunction (impaired relaxation). Right Ventricle: The right ventricular size is normal. No increase in right ventricular wall thickness. Right ventricular systolic function is normal. There is normal pulmonary artery  systolic pressure. Left Atrium: Left atrial size was normal in size. Right Atrium:  Right atrial size was normal in size. Pericardium: There is no evidence of pericardial effusion. Mitral Valve: The mitral valve is grossly normal. Moderate mitral annular calcification. No evidence of mitral valve regurgitation. No evidence of mitral valve stenosis. Tricuspid Valve: The tricuspid valve is normal in structure. Tricuspid valve regurgitation is not demonstrated. No evidence of tricuspid stenosis. Aortic Valve: The aortic valve is tricuspid. There is mild thickening of the aortic valve. Aortic valve regurgitation is not visualized. No aortic stenosis is present. Pulmonic Valve: The pulmonic valve was not well visualized. Pulmonic valve regurgitation is not visualized. No evidence of pulmonic stenosis. Aorta: The aortic root and ascending aorta are structurally normal, with no evidence of dilitation. Venous: The inferior vena cava is normal in size with greater than 50% respiratory variability, suggesting right atrial pressure of 3 mmHg. IAS/Shunts: The atrial septum is grossly normal.  LEFT VENTRICLE PLAX 2D LVIDd:         4.60 cm  Diastology LVIDs:         2.60 cm  LV e' medial:    5.29 cm/s LV PW:         1.00 cm  LV E/e' medial:  18.9 LV IVS:        1.10 cm  LV e' lateral:   8.73 cm/s LVOT diam:     2.00 cm  LV E/e' lateral: 11.5 LV SV:         95 LV SV Index:   45 LVOT Area:     3.14 cm  RIGHT VENTRICLE RV S prime:     12.20 cm/s TAPSE (M-mode): 2.0 cm LEFT ATRIUM             Index       RIGHT ATRIUM           Index LA diam:        3.70 cm 1.74 cm/m  RA Area:     14.00 cm LA Vol (A2C):   39.2 ml 18.40 ml/m RA Volume:   31.20 ml  14.64 ml/m LA Vol (A4C):   36.9 ml 17.32 ml/m LA Biplane Vol: 38.9 ml 18.26 ml/m  AORTIC VALVE LVOT Vmax:   124.00 cm/s LVOT Vmean:  78.300 cm/s LVOT VTI:    0.302 m  AORTA Ao Root diam: 3.80 cm Ao Asc diam:  3.10 cm MITRAL VALVE MV Area (PHT): 3.42 cm     SHUNTS MV Decel Time: 222  msec     Systemic VTI:  0.30 m MV E velocity: 100.00 cm/s  Systemic Diam: 2.00 cm MV A velocity: 94.30 cm/s MV E/A ratio:  1.06 Rudean Haskell MD Electronically signed by Rudean Haskell MD Signature Date/Time: 01/17/2021/5:25:33 PM    Final     Cardiac Studies     Patient Profile     75 y.o. male with history of CAD, HTN, HLD, carotid artery disease, CKD and recently diagnosed lung nodule who was admitted with atrial fib with RVR. Converted to sinus in the ED. Troponin peak 1199 and now trending down.   Assessment & Plan    Elevated troponin: Unclear if this is demand ischemia from atrial fib with RVR or true ischemia. His EKG changes on the day of admission were impressive even after he was sinus rhythm. Echo with normal LV systolic funciton. I think cardiac cath is indicated. He is known to have CAD. Cardiac cath today.   Atrial fib with RVR: He is in sinus rhythm today. He will need long term oral  anti-coagulation once all invasive testing is completed.   3.  Lung mass: Likely lung cancer. Outpatient pulmonary consult pending.   For questions or updates, please contact [place] Please consult [email or web address] for contact info under        Signed, Lauree Chandler, MD  01/19/2021, 7:35 AM

## 2021-01-19 NOTE — Progress Notes (Signed)
ANTICOAGULATION CONSULT NOTE - Follow Up Consult  Pharmacy Consult for heparin Indication: chest pain/ACS  Allergies  Allergen Reactions   Penicillins Shortness Of Breath    Did it involve swelling of the face/tongue/throat, SOB, or low BP? Yes Did it involve sudden or severe rash/hives, skin peeling, or any reaction on the inside of your mouth or nose? No Did you need to seek medical attention at a hospital or doctor's office? No When did it last happen? <10 yrs      If all above answers are "NO", may proceed with cephalosporin use.    Pravastatin Other (See Comments)    Cramp in lower limbs; all statins   Rosuvastatin Other (See Comments)    Muscle pain/cramps; all statins    Patient Measurements: Height: 5\' 9"  (175.3 cm) Weight: 94.9 kg (209 lb 3.5 oz) IBW/kg (Calculated) : 70.7 Heparin Dosing Weight: 91.1 kg  Vital Signs: Temp: 97.7 F (36.5 C) (07/07 1003) Temp Source: Oral (07/07 1003) BP: 137/64 (07/07 1107) Pulse Rate: 53 (07/07 1138)  Medical History: Past Medical History:  Diagnosis Date   Acute respiratory failure (HCC)    CAD (coronary artery disease)    CAP (community acquired pneumonia)    Chronic low back pain    s/p steroid injections   DOE (dyspnea on exertion)    Dyslipidemia    Emphysema lung (HCC)    GERD (gastroesophageal reflux disease)    Gout    Hypercholesteremia    Hypertension    Myocardial ischemia    Assessment: 75 yo M with CP that started at 0600 this AM. Radiates to shoulders bilaterally. No SOB, fevers or N/V/D. Hx of CAD, on Plavix. No hx of Afib or taking AC. S/p ASA 324mg  + 3 SL NTG with EMS. Pain improved with NTG. Patient also now with new Afib w/ RVR. CHAD2VASc score of 4.   Heparin level is therapeutic at 0.52, on heparin 1500 units/hr. Hgb 11.4, plt 269. No s/sx of bleeding or infusion issues. Plan for cath 7/7.  Goal of Therapy:  Heparin level 0.3-0.7 units/ml Monitor platelets by anticoagulation protocol: Yes    Plan:  Continue heparin infusion at 1500 units/hr Monitor heparin level, CBC and s/s of bleeding    Antonietta Jewel, PharmD, [place] Pharmacist  Phone: [phone] 01/19/2021 12:53 PM  Please check AMION for all [place] phone numbers After 10:00 PM, call [place] [phone]  [facility] Cath today showing mild-mod non-critical CAD - patent LAD w/ mod in-stent restenosis. Plan to restart heparin infusion 2 hours after TR band removed (should be removed in next half hour- so timed for 7/7@1800 ) - will get levels with AM labs.   Antonietta Jewel, PharmD, [place] Clinical Pharmacist

## 2021-01-20 ENCOUNTER — Other Ambulatory Visit (HOSPITAL_COMMUNITY): Payer: Self-pay

## 2021-01-20 LAB — CBC
HCT: 35.4 % — ABNORMAL LOW (ref 39.0–52.0)
Hemoglobin: 11.6 g/dL — ABNORMAL LOW (ref 13.0–17.0)
MCH: 31.4 pg (ref 26.0–34.0)
MCHC: 32.8 g/dL (ref 30.0–36.0)
MCV: 95.9 fL (ref 80.0–100.0)
Platelets: 256 10*3/uL (ref 150–400)
RBC: 3.69 MIL/uL — ABNORMAL LOW (ref 4.22–5.81)
RDW: 13 % (ref 11.5–15.5)
WBC: 7.7 10*3/uL (ref 4.0–10.5)
nRBC: 0 % (ref 0.0–0.2)

## 2021-01-20 LAB — BASIC METABOLIC PANEL
Anion gap: 9 (ref 5–15)
BUN: 22 mg/dL (ref 8–23)
CO2: 24 mmol/L (ref 22–32)
Calcium: 8.8 mg/dL — ABNORMAL LOW (ref 8.9–10.3)
Chloride: 101 mmol/L (ref 98–111)
Creatinine, Ser: 1.42 mg/dL — ABNORMAL HIGH (ref 0.61–1.24)
GFR, Estimated: 52 mL/min — ABNORMAL LOW (ref >60)
Glucose, Bld: 103 mg/dL — ABNORMAL HIGH (ref 70–99)
Potassium: 4.2 mmol/L (ref 3.5–5.1)
Sodium: 134 mmol/L — ABNORMAL LOW (ref 135–145)

## 2021-01-20 LAB — HEPARIN LEVEL (UNFRACTIONATED): Heparin Unfractionated: 0.36 IU/mL (ref 0.30–0.70)

## 2021-01-20 MED ORDER — APIXABAN 5 MG PO TABS
5.0000 mg | ORAL_TABLET | Freq: Two times a day (BID) | ORAL | 0 refills | Status: DC
  Filled 2021-01-20: qty 60, 30d supply, fill #0

## 2021-01-20 MED ORDER — LISINOPRIL 20 MG PO TABS
40.0000 mg | ORAL_TABLET | Freq: Every day | ORAL | Status: DC
  Administered 2021-01-20: 40 mg via ORAL
  Filled 2021-01-20: qty 2

## 2021-01-20 MED ORDER — APIXABAN 5 MG PO TABS
5.0000 mg | ORAL_TABLET | Freq: Two times a day (BID) | ORAL | Status: DC
  Administered 2021-01-20: 5 mg via ORAL
  Filled 2021-01-20: qty 1

## 2021-01-20 MED ORDER — HYDROCHLOROTHIAZIDE 25 MG PO TABS
12.5000 mg | ORAL_TABLET | Freq: Every day | ORAL | Status: DC
  Administered 2021-01-20: 12.5 mg via ORAL
  Filled 2021-01-20: qty 1

## 2021-01-20 NOTE — Discharge Instructions (Addendum)
Thank you for letting us take care of you at [facility].  You were admitted for concern of having a heart attack.  After evaluation with heart catheterization it was found you did not have worsening of your coronary artery disease.  The elevated cardiac enzyme, troponin, was from a irregular heart rhythm causing your heart to strain.  Your heart was back in a normal rhythm at discharge.  This heart arrhythmia puts you at risk for forming a clot which can go to your brain and cause a stroke.  Therefore you require a medication commonly referred to as a blood thinner. The pharmacy gave you one month supply of Eliquis, blood thinner. Please follow up with the [facility] to arrange how to get longterm this medication affordably.   Information on my medicine - ELIQUIS (apixaban)  Why was Eliquis prescribed for you? Eliquis was prescribed for you to reduce the risk of a blood clot forming that can cause a stroke if you have a medical condition called atrial fibrillation (a type of irregular heartbeat).  What do You need to know about Eliquis ? Take your Eliquis TWICE DAILY - one tablet in the morning and one tablet in the evening with or without food. If you have difficulty swallowing the tablet whole please discuss with your pharmacist how to take the medication safely.  Take Eliquis exactly as prescribed by your doctor and DO NOT stop taking Eliquis without talking to the doctor who prescribed the medication.  Stopping may increase your risk of developing a stroke.  Refill your prescription before you run out.  After discharge, you should have regular check-up appointments with your healthcare provider that is prescribing your Eliquis.  In the future your dose may need to be changed if your kidney function or weight changes by a significant amount or as you get older.  What do you do if you miss a dose? If you miss a dose, take it as soon as you remember on the same day and resume taking twice daily.   Do not take more than one dose of ELIQUIS at the same time to make up a missed dose.  Important Safety Information A possible side effect of Eliquis is bleeding. You should call your healthcare provider right away if you experience any of the following: Bleeding from an injury or your nose that does not stop. Unusual colored urine (red or dark brown) or unusual colored stools (red or black). Unusual bruising for unknown reasons. A serious fall or if you hit your head (even if there is no bleeding).  Some medicines may interact with Eliquis and might increase your risk of bleeding or clotting while on Eliquis. To help avoid this, consult your healthcare provider or pharmacist prior to using any new prescription or non-prescription medications, including herbals, vitamins, non-steroidal anti-inflammatory drugs (NSAIDs) and supplements.  This website has more information on Eliquis (apixaban): http://www.eliquis.com/eliquis/home

## 2021-01-20 NOTE — Progress Notes (Signed)
Blood pressure- 181/75 asymptomatic. MD aware with order. Lisinopril and hctz given. Continue to monitor.

## 2021-01-20 NOTE — Progress Notes (Signed)
Progress Note  Patient Name: Tony Garcia Date of Encounter: 01/20/2021  [place] Cardiologist: J[facility])  Subjective   No chest pain or dyspnea.   Inpatient Medications    Scheduled Meds:  aspirin EC  81 mg Oral Daily   ezetimibe  5 mg Oral Daily   fluticasone furoate-vilanterol  1 puff Inhalation Daily   pantoprazole  40 mg Oral BID   pravastatin  20 mg Oral q1800   sodium chloride flush  3 mL Intravenous Q12H   Continuous Infusions:  sodium chloride     heparin 1,500 Units/hr (01/19/21 2100)   PRN Meds: sodium chloride, acetaminophen, albuterol, nitroGLYCERIN, ondansetron (ZOFRAN) IV, polyvinyl alcohol, sodium chloride flush   Vital Signs    Vitals:   01/19/21 2340 01/20/21 0300 01/20/21 0334 01/20/21 0756  BP: (!) 161/61  136/66 124/85  Pulse: (!) 51 (!) 43 (!) 46 60  Resp: 17 15 20 19   Temp: 97.7 F (36.5 C)  97.8 F (36.6 C) 98.1 F (36.7 C)  TempSrc: Oral  Oral Oral  SpO2: 97% 95% 98% 97%  Weight:      Height:        Intake/Output Summary (Last 24 hours) at 01/20/2021 0802 Last data filed at 01/20/2021 0500 Gross per 24 hour  Intake 795.26 ml  Output 1625 ml  Net -829.74 ml   Last 3 Weights 01/17/2021 01/17/2021 04/09/2019  Weight (lbs) 209 lb 3.5 oz 215 lb 215 lb 6.2 oz  Weight (kg) 94.9 kg 97.523 kg 97.7 kg      Telemetry    Sinus - Personally Reviewed  ECG    No AM EKG   Physical Exam   General: Well developed, well nourished, NAD  HEENT: OP clear, mucus membranes moist  SKIN: warm, dry. No rashes. Neuro: No focal deficits  Musculoskeletal: Muscle strength 5/5 all ext  Psychiatric: Mood and affect normal  Neck: No JVD, no carotid bruits, no thyromegaly, no lymphadenopathy.  Lungs:Clear bilaterally, no wheezes, rhonci, crackles Cardiovascular: Regular rate and rhythm. No murmurs, gallops or rubs. Abdomen:Soft. Bowel sounds present. Non-tender.  Extremities: No lower extremity edema. Pulses are 2 + in the  bilateral DP/PT.   Labs    High Sensitivity Troponin:   Recent Labs  Lab 01/17/21 0753 01/17/21 1037 01/17/21 1331 01/17/21 1835 01/17/21 2218  TROPONINIHS 107* 619* 1,199* 1,026* 994*      Chemistry Recent Labs  Lab 01/17/21 0753 01/17/21 1835 01/18/21 0621 01/19/21 0036 01/20/21 0110  NA 134*   < > 135 134* 134*  K 3.7   < > 4.1 4.4 4.2  CL 102   < > 105 103 101  CO2 21*   < > 24 23 24   GLUCOSE 115*   < > 101* 104* 103*  BUN 32*   < > 25* 25* 22  CREATININE 2.13*   < > 1.45* 1.44* 1.42*  CALCIUM 9.8   < > 9.3 9.1 8.8*  PROT 7.0  --  6.4*  --   --   ALBUMIN 3.6  --  3.2*  --   --   AST 21  --  23  --   --   ALT 19  --  16  --   --   ALKPHOS 89  --  74  --   --   BILITOT 1.0  --  0.7  --   --   GFRNONAA 32*   < > 50* 51* 52*  ANIONGAP 11   < >  6 8 9    < > = values in this interval not displayed.     Hematology Recent Labs  Lab 01/18/21 0205 01/19/21 0036 01/20/21 0110  WBC 5.5 6.8 7.7  RBC 3.53* 3.64* 3.69*  HGB 11.4* 11.4* 11.6*  HCT 34.0* 34.3* 35.4*  MCV 96.3 94.2 95.9  MCH 32.3 31.3 31.4  MCHC 33.5 33.2 32.8  RDW 13.2 13.1 13.0  PLT 245 269 256    BNP Recent Labs  Lab 01/17/21 0832  BNP 81.9     DDimer No results for input(s): DDIMER in the last 168 hours.   Radiology    CARDIAC CATHETERIZATION  Result Date: 01/19/2021 Conclusions: 1. Mild to moderate, non-critical coronary artery disease. 2. Patent overlapping LAD stents with moderate in-stent restenosis (~40%). 3. Mildly elevated left ventricular filling pressure (LVEDP 20-25 mmHg). Recommendations: 1. Images reviewed with Dr. Angelena Form.  Elevated troponin and chest discomfort in the setting of atrial fibrillation with rapid ventricular response are consistent with demand ischemia.  Recommend medical therapy. 2. Restart IV heparin 2 hours after removal of TR band.  Consider transitioning to [place] as soon as tomorrow morning if there is no evidence of bleeding or vascular injury. 3.  Aggressive secondary prevention of coronary artery disease. Nelva Bush, MD [facility]    Cardiac Studies     Patient Profile     75 y.o. male with history of CAD, HTN, HLD, carotid artery disease, CKD and recently diagnosed lung nodule who was admitted with atrial fib with RVR. Converted to sinus in the ED. Troponin peak 1199. Cardiac cath with   Assessment & Plan    Elevated troponin: Likely due to demand ischemia in setting of atrial fib with RVR. Cardiac cath with stable CAD. Echo with normal LV systolic funciton.   Atrial fib with RVR: Sinus today. Will start Eliquis 5 mg po BID today.  CHADS VASC score is 4.   3.  Lung mass: Likely lung cancer. Outpatient pulmonary consult pending.   For questions or updates, please contact [facility] Please consult [email or web address] for contact info under      Signed, Lauree Chandler, MD  01/20/2021, 8:02 AM

## 2021-01-20 NOTE — Progress Notes (Signed)
Latest Bp -167/72, asymptomatic. Md made aware gave order , ok to d/c home

## 2021-01-20 NOTE — TOC Transition Note (Addendum)
Transition of Care [facility]) - CM/SW Discharge Note   Patient Details  Name: Tony Garcia MRN: [id number] Date of Birth: 21-Sep-1945  [facility] CM/SW Contact:  Zenon Mayo, RN Phone Number: 01/20/2021, 12:17 PM   Clinical Narrative:    Patient is for dc today, NCM spoke with patient about his co pay amt, informed him after his deductible has been met the copay will be 47.00 .  TOC to fill the first 30 days free with coupon for him.  Patient states he also goes to [facility] to get meds and his Cardiologist is Filbert Berthold.   Final next level of care: Home/Self Care Barriers to Discharge: No Barriers Identified   Patient Goals and CMS Choice Patient states their goals for this hospitalization and ongoing recovery are:: return home      Discharge Placement                       Discharge Plan and Services                  DME Agency: NA       HH Arranged: NA          Social Determinants of Health (SDOH) Interventions     Readmission Risk Interventions No flowsheet data found.

## 2021-01-20 NOTE — Progress Notes (Signed)
Discharge instructions given to pt and daughter, Belongings taken home.

## 2021-01-20 NOTE — Discharge Summary (Signed)
Name: Tony Garcia MRN: [id number] DOB: [birth year] 75 y.o. PCP: [name], MD  Date of Admission: 01/17/2021  7:41 AM Date of Discharge:  01/20/2021   Attending Physician: Velna Ochs, MD  Discharge Diagnosis: 1. NSTEMI from Demand Ischemia secondary to A.fib RVR 2. A.fib RVR   Discharge Medications: Allergies as of 01/20/2021       Reactions   Penicillins Shortness Of Breath   Did it involve swelling of the face/tongue/throat, SOB, or low BP? Yes Did it involve sudden or severe rash/hives, skin peeling, or any reaction on the inside of your mouth or nose? No Did you need to seek medical attention at a hospital or doctor's office? No When did it last happen? <10 yrs      If all above answers are "NO", may proceed with cephalosporin use.   Pravastatin Other (See Comments)   Cramp in lower limbs; all statins   Rosuvastatin Other (See Comments)   Muscle pain/cramps; all statins        Medication List     STOP taking these medications    clopidogrel 75 MG tablet Commonly known as: PLAVIX       TAKE these medications    albuterol (2.5 MG/3ML) 0.083% nebulizer solution Commonly known as: PROVENTIL Take 2.5 mg by nebulization every 6 (six) hours as needed for wheezing or shortness of breath.   albuterol 108 (90 Base) MCG/ACT inhaler Commonly known as: VENTOLIN HFA Inhale 2 puffs into the lungs every 6 (six) hours as needed for wheezing or shortness of breath.   apixaban 5 MG Tabs tablet Commonly known as: ELIQUIS Take 1 tablet (5 mg total) by mouth 2 (two) times daily.   aspirin EC 81 MG tablet Take 81 mg by mouth daily.   cetirizine 10 MG tablet Commonly known as: ZYRTEC Take 10 mg by mouth at bedtime.   ezetimibe 10 MG tablet Commonly known as: ZETIA Take 5 mg by mouth daily.   Fish Oil 1000 MG Caps Take 2,000 mg by mouth 2 (two) times daily.   fluticasone 50 MCG/ACT nasal spray Commonly known as: FLONASE Place 1-2 sprays into  both nostrils daily as needed for allergies or rhinitis.   fluticasone-salmeterol 250-50 MCG/ACT Aepb Commonly known as: ADVAIR Take 1 puff by mouth 2 (two) times daily. Rinse mouth after each use   hydrochlorothiazide 25 MG tablet Commonly known as: HYDRODIURIL Take 12.5 mg by mouth daily. Hold dose for systolic BP less than 528; keep well hydrated.   hydroxypropyl methylcellulose / hypromellose 2.5 % ophthalmic solution Commonly known as: ISOPTO TEARS / GONIOVISC Place 1 drop into both eyes 3 (three) times daily as needed for dry eyes.   lisinopril 40 MG tablet Commonly known as: ZESTRIL Take 40 mg by mouth daily.   lovastatin 20 MG tablet Commonly known as: MEVACOR Take 60 mg by mouth at bedtime.   nitroGLYCERIN 0.4 MG SL tablet Commonly known as: NITROSTAT Place 0.4 mg under the tongue every 5 (five) minutes as needed for chest pain.   pantoprazole 40 MG tablet Commonly known as: PROTONIX Take 1 tablet (40 mg total) by mouth 2 (two) times daily.   sodium chloride 0.65 % Soln nasal spray Commonly known as: OCEAN Place 2 sprays into both nostrils as needed for congestion.        Disposition and follow-up:   Tony Garcia was discharged from [facility] in Stable condition.  At the hospital follow up visit please address:  [ ]   Optimization of BP meds and discuss lifestyle modification.   2.  Labs / imaging needed at time of follow-up: none  3.  Pending labs/ test needing follow-up: none  Follow-up Appointments: Follow up with the [facility] by problem list: Tony Garcia is 75 y.o. male with a past medical history of hypertension, hyperlipidemia, CAD status post balloon angioplasty and DES to his proximal LAD in 2017, COPD, and a lung nodule concerning for bronchogenic carcinoma. He was admitted for NSTEMI when he presented to the ED with chest pain and shortness of breath.  His hospital course by problem is as  follows:  NSTEMI - Demand ischemia in setting of A.fib RVR Patient presented with chest pain and shortness of breath lasting more than 15 minutes. Pain relieved with nitroglycerin and aspirin en-route to the ED. EKG on arrival showed atrial fibrillation with ventricular rate of 111 and ST depression in leads II, III, aVF, V4, and V5.  He was started on IV heparin and admitted for an NSTEMI. High-sensitivity troponin peaked at 1199 then down-trended. He spontaneously converted to sinus rhythm without intervention. Echo showed no wall motion abnormalities, mild LVH and grade 1 diastolic dysfunction. EF of 60-65%.  Heart cath was delayed due to AKI on presentation.  [facility]earth cath completed on 7/7 which showed mild to moderate, non-critical coronary artery disease; Patent overlapping LAD stents with moderate in-stent restenosis (~40%); and mildly elevated left ventricular filling pressure (LVEDP 20-25 mmHg). Cardiology documentation noted that the elevated troponin and chest discomfort in setting of A.fib RVR is consistent with demand ischemia. Given no new significant stenosis on most recent heart cath, cardiology recommends continuation of anticoagulation with a DOAC and aggressive secondary  prevention of coronary artery disease.  Most recent (10/24/20) A1c is 6.3. Blood pressure has mostly been in 595G systolic average.  Patient discharged on a month supply of 5 mg Eliquis. [facility]   AKI on Possible CKD IIIa  Etiology unclear but improving without treatment or intervention.  He likely has some degree of CKD, however [facility] we do not have access to his records.  Renal function on admission notable for a creatinine of 2.13 with GFR of 32. Renal function improved during admission.    Creatinine of 1.42 and GFR 52 on day of discharge.  Lung nodule: Recently diagnosed, PET scan showed mild hypermetabolic activity concerning for primary  bronchogenic carcinoma. He was scheduled to have EBUS the day of admission. Patient would need to call to reschedule that appointment with the [facility] after discharge.     Subjective Patient says he is feeling much better today and was made aware of results from the heart catheterization. Says he is ready to go home. He also expressed understanding of need to [facility] of Eliquis that was started on this hospital admission.   Discharge vitals  BP (!) 183/83 (BP Location: Left Arm)   Pulse 60   Temp 98.4 F (36.9 C)   Resp 14   Ht 5\' 9"  (1.753 m)   Wt 94.9 kg   SpO2 98%   BMI 30.90 kg/m   Discharge exams General: Pleasant, well-appearing middle-age male laying in bed in no acute distress. Head: Normocephalic. Atraumatic. CV: distant heart sounds but RRR. No murmurs, rubs, or gallops. No LE edema Pulmonary: Lungs CTAB. Normal effort. No wheezing or rales. Abdominal: Soft, nontender, nondistended. Normal  bowel sounds. Extremities: Palpable pulses. Normal ROM. Skin: Warm and dry. No obvious rash or lesions. Neuro: A&Ox3. Moves all extremities. Normal sensation. No focal deficit. Psych: Normal mood and affect  Discharge Instructions: Discharge Instructions     Diet - low sodium heart healthy   Complete by: As directed    Increase activity slowly   Complete by: As directed        Pertinent Labs, Studies, and Procedures:  CARDIAC CATHETERIZATION  Result Date: 01/19/2021 Conclusions: 1. Mild to moderate, non-critical coronary artery disease. 2. Patent overlapping LAD stents with moderate in-stent restenosis (~40%). 3. Mildly elevated left ventricular filling pressure (LVEDP 20-25 mmHg). Recommendations: 1. Images reviewed with Dr. Angelena Form.  Elevated troponin and chest discomfort in the setting of atrial fibrillation with rapid ventricular response are consistent with demand ischemia.  Recommend medical therapy. 2. Restart IV heparin 2 hours after  removal of TR band.  Consider transitioning to [place] as soon as tomorrow morning if there is no evidence of bleeding or vascular injury. 3. Aggressive secondary prevention of coronary artery disease. Nelva Bush, MD [facility]   DG Chest Port 1 View  Result Date: 01/17/2021 CLINICAL DATA:  Chest pain and shortness of breath EXAM: PORTABLE CHEST 1 VIEW COMPARISON:  04/12/2019 FINDINGS: The heart size and mediastinal contours are within normal limits. Both lungs are clear. The visualized skeletal structures are unremarkable. IMPRESSION: No active disease. Electronically Signed   By: Miachel Roux M.D.   On: 01/17/2021 08:08   ECHOCARDIOGRAM COMPLETE  Result Date: 01/17/2021    ECHOCARDIOGRAM REPORT   Patient Name:   Tony Garcia Date of Exam: 01/17/2021 Medical Rec #:  [id number]        Height:       69.0 in Accession #:    [id number]       Weight:       215.0 lb Date of Birth:  06/25/1946         BSA:          2.130 m Patient Age:    86 years         BP:           83/73 mmHg Patient Gender: M                HR:           52 bpm. Exam Location:  Inpatient Procedure: 2D Echo, Cardiac Doppler and Color Doppler Indications:    R07.9* Chest pain, unspecified  History:        Patient has prior history of Echocardiogram examinations, most                 recent 04/10/2019. CAD; Risk Factors:Dyslipidemia and                 Hypertension.  Sonographer:    Bernadene Person RDCS Referring Phys: [place]  1. Left ventricular ejection fraction, by estimation, is 60 to 65%. The left ventricle has normal function. The left ventricle has no regional wall motion abnormalities. There is mild left ventricular hypertrophy. Left ventricular diastolic parameters are consistent with Grade I diastolic dysfunction (impaired relaxation).  2. Right ventricular systolic function is normal. The right ventricular size is normal. There is normal pulmonary artery systolic pressure.  3. The mitral valve is grossly  normal. No evidence of mitral valve regurgitation. No evidence of mitral stenosis. Moderate mitral annular calcification.  4. The aortic valve is tricuspid. There is mild thickening  of the aortic valve. Aortic valve regurgitation is not visualized. No aortic stenosis is present.  5. The inferior vena cava is normal in size with greater than 50% respiratory variability, suggesting right atrial pressure of 3 mmHg. Comparison(s): A prior study was performed on 04/10/2019. No significant change from prior study. FINDINGS  Left Ventricle: Left ventricular ejection fraction, by estimation, is 60 to 65%. The left ventricle has normal function. The left ventricle has no regional wall motion abnormalities. The left ventricular internal cavity size was normal in size. There is  mild left ventricular hypertrophy. Left ventricular diastolic parameters are consistent with Grade I diastolic dysfunction (impaired relaxation). Right Ventricle: The right ventricular size is normal. No increase in right ventricular wall thickness. Right ventricular systolic function is normal. There is normal pulmonary artery systolic pressure. Left Atrium: Left atrial size was normal in size. Right Atrium: Right atrial size was normal in size. Pericardium: There is no evidence of pericardial effusion. Mitral Valve: The mitral valve is grossly normal. Moderate mitral annular calcification. No evidence of mitral valve regurgitation. No evidence of mitral valve stenosis. Tricuspid Valve: The tricuspid valve is normal in structure. Tricuspid valve regurgitation is not demonstrated. No evidence of tricuspid stenosis. Aortic Valve: The aortic valve is tricuspid. There is mild thickening of the aortic valve. Aortic valve regurgitation is not visualized. No aortic stenosis is present. Pulmonic Valve: The pulmonic valve was not well visualized. Pulmonic valve regurgitation is not visualized. No evidence of pulmonic stenosis. Aorta: The aortic root and  ascending aorta are structurally normal, with no evidence of dilitation. Venous: The inferior vena cava is normal in size with greater than 50% respiratory variability, suggesting right atrial pressure of 3 mmHg. IAS/Shunts: The atrial septum is grossly normal.  LEFT VENTRICLE PLAX 2D LVIDd:         4.60 cm  Diastology LVIDs:         2.60 cm  LV e' medial:    5.29 cm/s LV PW:         1.00 cm  LV E/e' medial:  18.9 LV IVS:        1.10 cm  LV e' lateral:   8.73 cm/s LVOT diam:     2.00 cm  LV E/e' lateral: 11.5 LV SV:         95 LV SV Index:   45 LVOT Area:     3.14 cm  RIGHT VENTRICLE RV S prime:     12.20 cm/s TAPSE (M-mode): 2.0 cm LEFT ATRIUM             Index       RIGHT ATRIUM           Index LA diam:        3.70 cm 1.74 cm/m  RA Area:     14.00 cm LA Vol (A2C):   39.2 ml 18.40 ml/m RA Volume:   31.20 ml  14.64 ml/m LA Vol (A4C):   36.9 ml 17.32 ml/m LA Biplane Vol: 38.9 ml 18.26 ml/m  AORTIC VALVE LVOT Vmax:   124.00 cm/s LVOT Vmean:  78.300 cm/s LVOT VTI:    0.302 m  AORTA Ao Root diam: 3.80 cm Ao Asc diam:  3.10 cm MITRAL VALVE MV Area (PHT): 3.42 cm     SHUNTS MV Decel Time: 222 msec     Systemic VTI:  0.30 m MV E velocity: 100.00 cm/s  Systemic Diam: 2.00 cm MV A velocity: 94.30 cm/s MV E/A ratio:  1.06 Mahesh  Gasper Sells MD Electronically signed by Rudean Haskell MD Signature Date/Time: 01/17/2021/5:25:33 PM    Final      Results for orders placed or performed during the hospital encounter of 01/17/21 (from the past 72 hour(s))  Troponin I (High Sensitivity)     Status: Abnormal   Collection Time: 01/17/21  1:31 PM  Result Value Ref Range   Troponin I (High Sensitivity) 1,199 (HH) <18 ng/L    Comment: CRITICAL VALUE NOTED.  VALUE IS CONSISTENT WITH PREVIOUSLY REPORTED AND CALLED VALUE. (NOTE) Elevated high sensitivity troponin I (hsTnI) values and significant  changes across serial measurements may suggest ACS but many other  chronic and acute conditions are known to elevate hsTnI  results.  Refer to the Links section for chest pain algorithms and additional  guidance. Performed at [facility], [place] [address] [place], [place] [address]   Troponin I (High Sensitivity)     Status: Abnormal   Collection Time: 01/17/21  6:35 PM  Result Value Ref Range   Troponin I (High Sensitivity) 1,026 (HH) <18 ng/L    Comment: CRITICAL VALUE NOTED.  VALUE IS CONSISTENT WITH PREVIOUSLY REPORTED AND CALLED VALUE. (NOTE) Elevated high sensitivity troponin I (hsTnI) values and significant  changes across serial measurements may suggest ACS but many other  chronic and acute conditions are known to elevate hsTnI results.  Refer to the Links section for chest pain algorithms and additional  guidance. Performed at [facility], [place] [address], [place], [place] [address]   Basic metabolic panel     Status: Abnormal   Collection Time: 01/17/21  6:35 PM  Result Value Ref Range   Sodium 135 135 - 145 mmol/L   Potassium 4.4 3.5 - 5.1 mmol/L   Chloride 103 98 - 111 mmol/L   CO2 22 22 - 32 mmol/L   Glucose, Bld 130 (H) 70 - 99 mg/dL    Comment: Glucose reference range applies only to samples taken after fasting for at least 8 hours.   BUN 26 (H) 8 - 23 mg/dL   Creatinine, Ser 1.60 (H) 0.61 - 1.24 mg/dL   Calcium 9.7 8.9 - 10.3 mg/dL   GFR, Estimated 45 (L) >60 mL/min    Comment: (NOTE) Calculated using the CKD-EPI Creatinine Equation (2021)    Anion gap 10 5 - 15    Comment: Performed at [facility] [address] [place], [place] [address]  Magnesium     Status: Abnormal   Collection Time: 01/17/21  6:35 PM  Result Value Ref Range   Magnesium 1.6 (L) 1.7 - 2.4 mg/dL    Comment: Performed at [facility] [address] [place], [place] [address]  Heparin level (unfractionated)     Status: None   Collection Time: 01/17/21  6:35 PM  Result Value Ref Range   Heparin Unfractionated 0.31 0.30 - 0.70 IU/mL    Comment: (NOTE) The clinical reportable  range upper limit is being lowered to >1.10 to align with the FDA approved guidance for the current laboratory assay.  If heparin results are below expected values, and patient dosage has  been confirmed, suggest follow up testing of antithrombin III levels. Performed at [facility], [place] [address] [place], [place] [address]   Troponin I (High Sensitivity)     Status: Abnormal   Collection Time: 01/17/21 10:18 PM  Result Value Ref Range   Troponin I (High Sensitivity) 994 (HH) <18 ng/L    Comment:  CRITICAL VALUE NOTED.  VALUE IS CONSISTENT WITH PREVIOUSLY REPORTED AND CALLED VALUE. (NOTE) Elevated high sensitivity troponin I (hsTnI) values and significant  changes across serial measurements may suggest ACS but many other  chronic and acute conditions are known to elevate hsTnI results.  Refer to the Links section for chest pain algorithms and additional  guidance. Performed at [facility], [place] [address], [place], [place] [address]   CBC     Status: Abnormal   Collection Time: 01/18/21  2:05 AM  Result Value Ref Range   WBC 5.5 4.0 - 10.5 K/uL   RBC 3.53 (L) 4.22 - 5.81 MIL/uL   Hemoglobin 11.4 (L) 13.0 - 17.0 g/dL   HCT 34.0 (L) 39.0 - 52.0 %   MCV 96.3 80.0 - 100.0 fL   MCH 32.3 26.0 - 34.0 pg   MCHC 33.5 30.0 - 36.0 g/dL   RDW 13.2 11.5 - 15.5 %   Platelets 245 150 - 400 K/uL   nRBC 0.0 0.0 - 0.2 %    Comment: Performed at [facility], [place] [address], [place], [place] [address]  Heparin level (unfractionated)     Status: Abnormal   Collection Time: 01/18/21  2:05 AM  Result Value Ref Range   Heparin Unfractionated 0.22 (L) 0.30 - 0.70 IU/mL    Comment: (NOTE) The clinical reportable range upper limit is being lowered to >1.10 to align with the FDA approved guidance for the current laboratory assay.  If heparin results are below expected values, and patient dosage has  been confirmed, suggest follow up testing of antithrombin III  levels. Performed at [facility], [place] [address], [place], [place] [address]   Magnesium     Status: Abnormal   Collection Time: 01/18/21  6:21 AM  Result Value Ref Range   Magnesium 1.6 (L) 1.7 - 2.4 mg/dL    Comment: Performed at [facility] [address] [place], [place] [address]  Phosphorus     Status: None   Collection Time: 01/18/21  6:21 AM  Result Value Ref Range   Phosphorus 3.2 2.5 - 4.6 mg/dL    Comment: Performed at [facility] [address] [place], [place] [address]  Comprehensive metabolic panel     Status: Abnormal   Collection Time: 01/18/21  6:21 AM  Result Value Ref Range   Sodium 135 135 - 145 mmol/L   Potassium 4.1 3.5 - 5.1 mmol/L   Chloride 105 98 - 111 mmol/L   CO2 24 22 - 32 mmol/L   Glucose, Bld 101 (H) 70 - 99 mg/dL    Comment: Glucose reference range applies only to samples taken after fasting for at least 8 hours.   BUN 25 (H) 8 - 23 mg/dL   Creatinine, Ser 1.45 (H) 0.61 - 1.24 mg/dL   Calcium 9.3 8.9 - 10.3 mg/dL   Total Protein 6.4 (L) 6.5 - 8.1 g/dL   Albumin 3.2 (L) 3.5 - 5.0 g/dL   AST 23 15 - 41 U/L   ALT 16 0 - 44 U/L   Alkaline Phosphatase 74 38 - 126 U/L   Total Bilirubin 0.7 0.3 - 1.2 mg/dL   GFR, Estimated 50 (L) >60 mL/min    Comment: (NOTE) Calculated using the CKD-EPI Creatinine Equation (2021)    Anion gap 6 5 - 15    Comment: Performed at [facility], [place] [address] [place], [place] [address]  Heparin level (unfractionated)  Status: None   Collection Time: 01/18/21 11:40 AM  Result Value Ref Range   Heparin Unfractionated 0.36 0.30 - 0.70 IU/mL    Comment: (NOTE) The clinical reportable range upper limit is being lowered to >1.10 to align with the FDA approved guidance for the current laboratory assay.  If heparin results are below expected values, and patient dosage has  been confirmed, suggest follow up testing of antithrombin III levels. Performed at [facility], [place]  [address] [place], [place] [address]   MRSA Next Gen by PCR, Nasal     Status: None   Collection Time: 01/18/21  8:22 PM   Specimen: Nasal Mucosa; Nasal Swab  Result Value Ref Range   MRSA by PCR Next Gen NOT DETECTED NOT DETECTED    Comment: (NOTE) The GeneXpert MRSA Assay (FDA approved for NASAL specimens only), is one component of a comprehensive MRSA colonization surveillance program. It is not intended to diagnose MRSA infection nor to guide or monitor treatment for MRSA infections. Test performance is not FDA approved in patients less than 41 years old. Performed at [facility], [place] [address], [place], [place] [address]   CBC     Status: Abnormal   Collection Time: 01/19/21 12:36 AM  Result Value Ref Range   WBC 6.8 4.0 - 10.5 K/uL   RBC 3.64 (L) 4.22 - 5.81 MIL/uL   Hemoglobin 11.4 (L) 13.0 - 17.0 g/dL   HCT 34.3 (L) 39.0 - 52.0 %   MCV 94.2 80.0 - 100.0 fL   MCH 31.3 26.0 - 34.0 pg   MCHC 33.2 30.0 - 36.0 g/dL   RDW 13.1 11.5 - 15.5 %   Platelets 269 150 - 400 K/uL   nRBC 0.0 0.0 - 0.2 %    Comment: Performed at [facility], [place] [address] [place], [place] [address]  Heparin level (unfractionated)     Status: None   Collection Time: 01/19/21 12:36 AM  Result Value Ref Range   Heparin Unfractionated 0.52 0.30 - 0.70 IU/mL    Comment: (NOTE) The clinical reportable range upper limit is being lowered to >1.10 to align with the FDA approved guidance for the current laboratory assay.  If heparin results are below expected values, and patient dosage has  been confirmed, suggest follow up testing of antithrombin III levels. Performed at [facility], [place] [address] [place], [place] [address]   Magnesium     Status: None   Collection Time: 01/19/21 12:36 AM  Result Value Ref Range   Magnesium 2.1 1.7 - 2.4 mg/dL    Comment: Performed at [facility] [address] [place], [place] [address]  Lipid panel     Status: Abnormal   Collection Time:  01/19/21 12:36 AM  Result Value Ref Range   Cholesterol 177 0 - 200 mg/dL   Triglycerides 327 (H) <150 mg/dL   HDL 31 (L) >40 mg/dL   Total CHOL/HDL Ratio 5.7 RATIO   VLDL 65 (H) 0 - 40 mg/dL   LDL Cholesterol 81 0 - 99 mg/dL    Comment:        Total Cholesterol/HDL:CHD Risk Coronary Heart Disease Risk Table                     Men   Women  1/2 Average Risk   3.4   3.3  Average Risk       5.0   4.4  2  X Average Risk   9.6   7.1  3 X Average Risk  23.4   11.0        Use the calculated Patient Ratio above and the CHD Risk Table to determine the patient's CHD Risk.        ATP III CLASSIFICATION (LDL):  <100     mg/dL   Optimal  100-129  mg/dL   Near or Above                    Optimal  130-159  mg/dL   Borderline  160-189  mg/dL   High  >190     mg/dL   Very High Performed at [facility] [address], [place], [place] [address]   Basic metabolic panel     Status: Abnormal   Collection Time: 01/19/21 12:36 AM  Result Value Ref Range   Sodium 134 (L) 135 - 145 mmol/L   Potassium 4.4 3.5 - 5.1 mmol/L   Chloride 103 98 - 111 mmol/L   CO2 23 22 - 32 mmol/L   Glucose, Bld 104 (H) 70 - 99 mg/dL    Comment: Glucose reference range applies only to samples taken after fasting for at least 8 hours.   BUN 25 (H) 8 - 23 mg/dL   Creatinine, Ser 1.44 (H) 0.61 - 1.24 mg/dL   Calcium 9.1 8.9 - 10.3 mg/dL   GFR, Estimated 51 (L) >60 mL/min    Comment: (NOTE) Calculated using the CKD-EPI Creatinine Equation (2021)    Anion gap 8 5 - 15    Comment: Performed at [facility] [address], [place], [place] [address]  CBC     Status: Abnormal   Collection Time: 01/20/21  1:10 AM  Result Value Ref Range   WBC 7.7 4.0 - 10.5 K/uL   RBC 3.69 (L) 4.22 - 5.81 MIL/uL   Hemoglobin 11.6 (L) 13.0 - 17.0 g/dL   HCT 35.4 (L) 39.0 - 52.0 %   MCV 95.9 80.0 - 100.0 fL   MCH 31.4 26.0 - 34.0 pg   MCHC 32.8 30.0 - 36.0 g/dL   RDW 13.0 11.5 - 15.5 %   Platelets 256 150 - 400 K/uL    nRBC 0.0 0.0 - 0.2 %    Comment: Performed at [facility], [place] [address] [place], [place] [address]  Basic metabolic panel Once     Status: Abnormal   Collection Time: 01/20/21  1:10 AM  Result Value Ref Range   Sodium 134 (L) 135 - 145 mmol/L   Potassium 4.2 3.5 - 5.1 mmol/L   Chloride 101 98 - 111 mmol/L   CO2 24 22 - 32 mmol/L   Glucose, Bld 103 (H) 70 - 99 mg/dL    Comment: Glucose reference range applies only to samples taken after fasting for at least 8 hours.   BUN 22 8 - 23 mg/dL   Creatinine, Ser 1.42 (H) 0.61 - 1.24 mg/dL   Calcium 8.8 (L) 8.9 - 10.3 mg/dL   GFR, Estimated 52 (L) >60 mL/min    Comment: (NOTE) Calculated using the CKD-EPI Creatinine Equation (2021)    Anion gap 9 5 - 15    Comment: Performed at [facility] [address], [place], [place] [address]  Heparin level (unfractionated)     Status: None   Collection Time: 01/20/21  1:10 AM  Result Value Ref Range   Heparin Unfractionated 0.36 0.30 -  0.70 IU/mL    Comment: (NOTE) The clinical reportable range upper limit is being lowered to >1.10 to align with the FDA approved guidance for the current laboratory assay.  If heparin results are below expected values, and patient dosage has  been confirmed, suggest follow up testing of antithrombin III levels. Performed at [facility], [place] [address] [place], [place] [address]     Lab 01/17/21 0753 01/17/21 1037 01/17/21 1331 01/17/21 1835 01/17/21 2218  TROPONIN I HS 107* 619* 1,199* 1,026* 994*   Signed: Ross Ludwig, Medical Student 01/20/2021, 12:24 PM

## 2021-02-08 ENCOUNTER — Telehealth: Payer: Self-pay

## 2021-02-08 NOTE — Telephone Encounter (Signed)
Received Chest CT CD which was placed in Dr. Agustina Caroli look at folder.

## 2021-02-10 ENCOUNTER — Other Ambulatory Visit (HOSPITAL_COMMUNITY): Payer: Self-pay

## 2021-02-10 ENCOUNTER — Telehealth (HOSPITAL_COMMUNITY): Payer: Self-pay

## 2021-02-10 NOTE — Telephone Encounter (Signed)
Pharmacy Transitions of Care Follow-up Telephone Call  Date of discharge: 01/20/21  Discharge Diagnosis: Afib  How have you been since you were released from the hospital?  Patient doing well, no questions about meds at this time.  Medication changes made at discharge:     START taking: Eliquis (apixaban)   STOP taking: clopidogrel 75 MG tablet (PLAVIX)   Medication changes verified by the patient? Yes    Medication Accessibility:  Home Pharmacy:  [facility]  Was the patient provided with refills on discharged medications? No, getting meds through [place]  Have all prescriptions been transferred from [facility] to home pharmacy? N/A   Is the patient able to afford medications? Patient has [employer]    Medication Review:  APIXABAN (ELIQUIS)  Apixaban 5 mg BID initiated on 01/20/21.  - Discussed importance of taking medication around the same time everyday  - Advised patient of medications to avoid (NSAIDs, ASA)  - Educated that Tylenol (acetaminophen) will be the preferred analgesic to prevent risk of bleeding  - Emphasized importance of monitoring for signs and symptoms of bleeding (abnormal bruising, prolonged bleeding, nose bleeds, bleeding from gums, discolored urine, black tarry stools)  - Advised patient to alert all providers of anticoagulation therapy prior to starting a new medication or having a procedure    Follow-up Appointments:  PCP Hospital f/u appt confirmed? Saw PCP at [facility] previous week.  [facility] f/u appt confirmed? Yes Scheduled to see Dr. Lamonte Sakai on 02/16/21 @ Pul[facility]olgy.   If their condition worsens, is the pt aware to call PCP or go to the Emergency Dept.? yes  Final Patient Assessment: Has refills at home pharmacy and follow up scheduled

## 2021-02-16 ENCOUNTER — Other Ambulatory Visit: Payer: Self-pay

## 2021-02-16 ENCOUNTER — Encounter: Payer: Self-pay | Admitting: Emergency Medicine

## 2021-02-16 ENCOUNTER — Ambulatory Visit (INDEPENDENT_AMBULATORY_CARE_PROVIDER_SITE_OTHER): Payer: No Typology Code available for payment source | Admitting: Emergency Medicine

## 2021-02-16 ENCOUNTER — Telehealth: Payer: Self-pay | Admitting: Emergency Medicine

## 2021-02-16 VITALS — BP 142/84 | HR 88 | Temp 98.4°F | Ht 69.0 in | Wt 209.8 lb

## 2021-02-16 DIAGNOSIS — R9389 Abnormal findings on diagnostic imaging of other specified body structures: Secondary | ICD-10-CM | POA: Diagnosis not present

## 2021-02-16 DIAGNOSIS — R911 Solitary pulmonary nodule: Secondary | ICD-10-CM

## 2021-02-16 NOTE — H&P (View-Only) (Signed)
Subjective:    Patient ID: Tony Garcia, male    DOB: 1945/09/13, 75 y.o.   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  He also has a history of hypertension, hyperlipidemia, CAD.  Followed by Dr. Gwenette Greet.  He has had a slowly enlarging left lower lobe pulmonary nodule that dates back to 2017, had grown to 11 mm by CT chest 5/22.  A PET scan was done in June that showed mild hypermetabolism in the nodule and also in 4R and 4L lymph nodes.  PET scan 12/16/2020 reviewed by me showed small focus of left lower lobe hypermetabolic activity SUV max 2.2.  He has calcified hilar and mediastinal adenopathy most prominent is a 1.2 cm right paratracheal node, 8 mm right paratracheal node, 7 mm left paratracheal node with mild hypermetabolism   Review of Systems As per HPi  Past Medical History:  Diagnosis Date   Acute respiratory failure (HCC)    CAD (coronary artery disease)    CAP (community acquired pneumonia)    Chronic low back pain    s/p steroid injections   DOE (dyspnea on exertion)    Dyslipidemia    Emphysema lung (HCC)    GERD (gastroesophageal reflux disease)    Gout    Hypercholesteremia    Hypertension    Myocardial ischemia      No family history on file.   Social History   Socioeconomic History   Marital status: Widowed    Spouse name: Not on file   Number of children: Not on file   Years of education: Not on file   Highest education level: Not on file  Occupational History   Not on file  Tobacco Use   Smoking status: Former    Packs/day: 2.00    Years: 30.00    Pack years: 60.00    Types: Cigarettes   Smokeless tobacco: Never  Substance and Sexual Activity   Alcohol use: Yes   Drug use: No   Sexual activity: Not on file  Other Topics Concern   Not on file  Social History Narrative   Not on file   Social Determinants of Health   Financial Resource Strain: Not on  file  Food Insecurity: Not on file  Transportation Needs: Not on file  Physical Activity: Not on file  Stress: Not on file  Social Connections: Not on file  Intimate Partner Violence: Not on file    [employer], [place]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  No mold exposures.   Allergies  Allergen Reactions   Penicillins Shortness Of Breath    Did it involve swelling of the face/tongue/throat, SOB, or low BP? Yes Did it involve sudden or severe rash/hives, skin peeling, or any reaction on the inside of your mouth or nose? No Did you need to seek medical attention at a hospital or doctor's office? No When did it last happen? <10 yrs      If all above answers are "NO", may proceed with cephalosporin use.    Pravastatin Other (See Comments)    Cramp in lower limbs; all statins   Rosuvastatin Other (See Comments)    Muscle pain/cramps; all statins     Outpatient Medications Prior to Visit  Medication Sig Dispense Refill   albuterol (PROVENTIL) (2.5 MG/3ML) 0.083% nebulizer solution Take 2.5 mg by nebulization every 6 (six) hours as needed for wheezing or  shortness of breath.     albuterol (VENTOLIN HFA) 108 (90 Base) MCG/ACT inhaler Inhale 2 puffs into the lungs every 6 (six) hours as needed for wheezing or shortness of breath.     apixaban (ELIQUIS) 5 MG TABS tablet Take 1 tablet (5 mg total) by mouth 2 (two) times daily. 60 tablet 0   aspirin EC 81 MG tablet Take 81 mg by mouth daily.     cetirizine (ZYRTEC) 10 MG tablet Take 10 mg by mouth at bedtime.     ezetimibe (ZETIA) 10 MG tablet Take 5 mg by mouth daily.     fluticasone (FLONASE) 50 MCG/ACT nasal spray Place 1-2 sprays into both nostrils daily as needed for allergies or rhinitis.     fluticasone-salmeterol (ADVAIR) 250-50 MCG/ACT AEPB Take 1 puff by mouth 2 (two) times daily. Rinse mouth after each use     hydrochlorothiazide (HYDRODIURIL) 25 MG tablet Take 12.5 mg by mouth daily. Hold dose for systolic BP less  than 623; keep well hydrated.     hydroxypropyl methylcellulose / hypromellose (ISOPTO TEARS / GONIOVISC) 2.5 % ophthalmic solution Place 1 drop into both eyes 3 (three) times daily as needed for dry eyes.     lisinopril (ZESTRIL) 40 MG tablet Take 40 mg by mouth daily.     lovastatin (MEVACOR) 20 MG tablet Take 60 mg by mouth at bedtime.     nitroGLYCERIN (NITROSTAT) 0.4 MG SL tablet Place 0.4 mg under the tongue every 5 (five) minutes as needed for chest pain.     Omega-3 Fatty Acids (FISH OIL) 1000 MG CAPS Take 2,000 mg by mouth 2 (two) times daily.     pantoprazole (PROTONIX) 40 MG tablet Take 1 tablet (40 mg total) by mouth 2 (two) times daily. 60 tablet 0   sodium chloride (OCEAN) 0.65 % SOLN nasal spray Place 2 sprays into both nostrils as needed for congestion.     No facility-administered medications prior to visit.         Objective:   Physical Exam  Vitals:   02/16/21 1033  BP: (!) 142/84  Pulse: 88  Temp: 98.4 F (36.9 C)  TempSrc: Oral  SpO2: 99%  Weight: 209 lb 12.8 oz (95.2 kg)  Height: 5\' 9"  (1.753 m)   Gen: Pleasant, obese man, in no distress,  normal affect  ENT: No lesions,  mouth clear,  oropharynx clear, no postnasal drip  Neck: No JVD, no stridor  Lungs: No use of accessory muscles, no crackles or wheezing on normal respiration, slight wheeze on forced expiration  Cardiovascular: RRR, heart sounds normal, no murmur or gallops, no peripheral edema  Musculoskeletal: some DIP deformity hands, no cyanosis or clubbing  Neuro: alert, awake, non focal  Skin: Warm, no lesions or rash      Assessment & Plan:  Abnormal CT of the chest Small slowly enlarging mixed density left lower lobe pulmonary nodule with some very mild but detectable hypermetabolism on PET scan.  Also with enlargement of right and left paratracheal nodes 4R, 4L with some hypermetabolism.  Concerning for possible malignancy.  Agree that he needs mediastinal staging, hopefully will be  able to accomplish this with EBUS.  Also plan to navigate to his left lower lobe nodule and sample it if possible.  Depending on the mediastinal staging, other comorbidities, PFT he may be a candidate for surgical resection.  Alternatively SBRT versus chemo/radiation.  We will work on setting up bronchoscopy to evaluate your lymph nodes and small left  lower lobe pulmonary nodule.  This will be done as an outpatient at [facility] under general anesthesia.  You will need a designated driver..  We will try to get this arranged for 2021/03/13. You will need to come off of your Eliquis for 2 days prior to the bronchoscopy You will need a repeat CT scan of your chest.  We will work on arranging for this. Keep your albuterol available to use 2 puffs when needed for shortness of breath, chest tightness, wheezing. Follow with Dr Lamonte Sakai in 1 month  Baltazar Apo, MD, PhD 02/16/2021, 1:36 PM [facility] [phone] or if no answer before 7:00PM call [phone] For any issues after 7:00PM please call eLink [phone]

## 2021-02-16 NOTE — Assessment & Plan Note (Signed)
Small slowly enlarging mixed density left lower lobe pulmonary nodule with some very mild but detectable hypermetabolism on PET scan.  Also with enlargement of right and left paratracheal nodes 4R, 4L with some hypermetabolism.  Concerning for possible malignancy.  Agree that he needs mediastinal staging, hopefully will be able to accomplish this with EBUS.  Also plan to navigate to his left lower lobe nodule and sample it if possible.  Depending on the mediastinal staging, other comorbidities, PFT he may be a candidate for surgical resection.  Alternatively SBRT versus chemo/radiation.  We will work on setting up bronchoscopy to evaluate your lymph nodes and small left lower lobe pulmonary nodule.  This will be done as an outpatient at [facility] under general anesthesia.  You will need a designated driver..  We will try to get this arranged for 03/16/21. You will need to come off of your Eliquis for 2 days prior to the bronchoscopy You will need a repeat CT scan of your chest.  We will work on arranging for this. Keep your albuterol available to use 2 puffs when needed for shortness of breath, chest tightness, wheezing. Follow with Dr Lamonte Sakai in 1 month

## 2021-02-16 NOTE — Progress Notes (Signed)
Subjective:    Patient ID: Tony Garcia, male    DOB: 1945-08-29, 75 y.o.   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  He also has a history of hypertension, hyperlipidemia, CAD.  Followed by Dr. Gwenette Greet.  He has had a slowly enlarging left lower lobe pulmonary nodule that dates back to 2017, had grown to 11 mm by CT chest 5/22.  A PET scan was done in June that showed mild hypermetabolism in the nodule and also in 4R and 4L lymph nodes.  PET scan 12/16/2020 reviewed by me showed small focus of left lower lobe hypermetabolic activity SUV max 2.2.  He has calcified hilar and mediastinal adenopathy most prominent is a 1.2 cm right paratracheal node, 8 mm right paratracheal node, 7 mm left paratracheal node with mild hypermetabolism   Review of Systems As per HPi  Past Medical History:  Diagnosis Date   Acute respiratory failure (HCC)    CAD (coronary artery disease)    CAP (community acquired pneumonia)    Chronic low back pain    s/p steroid injections   DOE (dyspnea on exertion)    Dyslipidemia    Emphysema lung (HCC)    GERD (gastroesophageal reflux disease)    Gout    Hypercholesteremia    Hypertension    Myocardial ischemia      No family history on file.   Social History   Socioeconomic History   Marital status: Widowed    Spouse name: Not on file   Number of children: Not on file   Years of education: Not on file   Highest education level: Not on file  Occupational History   Not on file  Tobacco Use   Smoking status: Former    Packs/day: 2.00    Years: 30.00    Pack years: 60.00    Types: Cigarettes   Smokeless tobacco: Never  Substance and Sexual Activity   Alcohol use: Yes   Drug use: No   Sexual activity: Not on file  Other Topics Concern   Not on file  Social History Narrative   Not on file   Social Determinants of Health   Financial Resource Strain: Not on  file  Food Insecurity: Not on file  Transportation Needs: Not on file  Physical Activity: Not on file  Stress: Not on file  Social Connections: Not on file  Intimate Partner Violence: Not on file    [employer], [place]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  No mold exposures.   Allergies  Allergen Reactions   Penicillins Shortness Of Breath    Did it involve swelling of the face/tongue/throat, SOB, or low BP? Yes Did it involve sudden or severe rash/hives, skin peeling, or any reaction on the inside of your mouth or nose? No Did you need to seek medical attention at a hospital or doctor's office? No When did it last happen? <10 yrs      If all above answers are "NO", may proceed with cephalosporin use.    Pravastatin Other (See Comments)    Cramp in lower limbs; all statins   Rosuvastatin Other (See Comments)    Muscle pain/cramps; all statins     Outpatient Medications Prior to Visit  Medication Sig Dispense Refill   albuterol (PROVENTIL) (2.5 MG/3ML) 0.083% nebulizer solution Take 2.5 mg by nebulization every 6 (six) hours as needed for wheezing or  shortness of breath.     albuterol (VENTOLIN HFA) 108 (90 Base) MCG/ACT inhaler Inhale 2 puffs into the lungs every 6 (six) hours as needed for wheezing or shortness of breath.     apixaban (ELIQUIS) 5 MG TABS tablet Take 1 tablet (5 mg total) by mouth 2 (two) times daily. 60 tablet 0   aspirin EC 81 MG tablet Take 81 mg by mouth daily.     cetirizine (ZYRTEC) 10 MG tablet Take 10 mg by mouth at bedtime.     ezetimibe (ZETIA) 10 MG tablet Take 5 mg by mouth daily.     fluticasone (FLONASE) 50 MCG/ACT nasal spray Place 1-2 sprays into both nostrils daily as needed for allergies or rhinitis.     fluticasone-salmeterol (ADVAIR) 250-50 MCG/ACT AEPB Take 1 puff by mouth 2 (two) times daily. Rinse mouth after each use     hydrochlorothiazide (HYDRODIURIL) 25 MG tablet Take 12.5 mg by mouth daily. Hold dose for systolic BP less  than 102; keep well hydrated.     hydroxypropyl methylcellulose / hypromellose (ISOPTO TEARS / GONIOVISC) 2.5 % ophthalmic solution Place 1 drop into both eyes 3 (three) times daily as needed for dry eyes.     lisinopril (ZESTRIL) 40 MG tablet Take 40 mg by mouth daily.     lovastatin (MEVACOR) 20 MG tablet Take 60 mg by mouth at bedtime.     nitroGLYCERIN (NITROSTAT) 0.4 MG SL tablet Place 0.4 mg under the tongue every 5 (five) minutes as needed for chest pain.     Omega-3 Fatty Acids (FISH OIL) 1000 MG CAPS Take 2,000 mg by mouth 2 (two) times daily.     pantoprazole (PROTONIX) 40 MG tablet Take 1 tablet (40 mg total) by mouth 2 (two) times daily. 60 tablet 0   sodium chloride (OCEAN) 0.65 % SOLN nasal spray Place 2 sprays into both nostrils as needed for congestion.     No facility-administered medications prior to visit.         Objective:   Physical Exam  Vitals:   02/16/21 1033  BP: (!) 142/84  Pulse: 88  Temp: 98.4 F (36.9 C)  TempSrc: Oral  SpO2: 99%  Weight: 209 lb 12.8 oz (95.2 kg)  Height: 5\' 9"  (1.753 m)   Gen: Pleasant, obese man, in no distress,  normal affect  ENT: No lesions,  mouth clear,  oropharynx clear, no postnasal drip  Neck: No JVD, no stridor  Lungs: No use of accessory muscles, no crackles or wheezing on normal respiration, slight wheeze on forced expiration  Cardiovascular: RRR, heart sounds normal, no murmur or gallops, no peripheral edema  Musculoskeletal: some DIP deformity hands, no cyanosis or clubbing  Neuro: alert, awake, non focal  Skin: Warm, no lesions or rash      Assessment & Plan:  Abnormal CT of the chest Small slowly enlarging mixed density left lower lobe pulmonary nodule with some very mild but detectable hypermetabolism on PET scan.  Also with enlargement of right and left paratracheal nodes 4R, 4L with some hypermetabolism.  Concerning for possible malignancy.  Agree that he needs mediastinal staging, hopefully will be  able to accomplish this with EBUS.  Also plan to navigate to his left lower lobe nodule and sample it if possible.  Depending on the mediastinal staging, other comorbidities, PFT he may be a candidate for surgical resection.  Alternatively SBRT versus chemo/radiation.  We will work on setting up bronchoscopy to evaluate your lymph nodes and small left  lower lobe pulmonary nodule.  This will be done as an outpatient at [facility] under general anesthesia.  You will need a designated driver..  We will try to get this arranged for 04/02/2021. You will need to come off of your Eliquis for 2 days prior to the bronchoscopy You will need a repeat CT scan of your chest.  We will work on arranging for this. Keep your albuterol available to use 2 puffs when needed for shortness of breath, chest tightness, wheezing. Follow with Dr Lamonte Sakai in 1 month  Baltazar Apo, MD, PhD 02/16/2021, 1:36 PM [facility] [phone] or if no answer before 7:00PM call [phone] For any issues after 7:00PM please call eLink [phone]

## 2021-02-16 NOTE — Patient Instructions (Addendum)
We will work on setting up bronchoscopy to evaluate your lymph nodes and small left lower lobe pulmonary nodule.  This will be done as an outpatient at [facility] under general anesthesia.  You will need a designated driver..  We will try to get this arranged for March 17, 2021. You will need to come off of your Eliquis for 2 days prior to the bronchoscopy You will need a repeat CT scan of your chest.  We will work on arranging for this. Keep your albuterol available to use 2 puffs when needed for shortness of breath, chest tightness, wheezing. Follow with Dr Lamonte Sakai in 1 month

## 2021-02-16 NOTE — Telephone Encounter (Signed)
Pt has been scheduled for 8/22 at 7:30 at [facility].  I have faxed form to the [place] to get CT ordered to be done prior to this. Pt is to get covid test on 8/19.  Spoke to pt & gave him appt info.  Told him I would let him know when I find out about CT from [place].

## 2021-02-21 NOTE — Telephone Encounter (Signed)
Got an e-mail from Hulen Skains - pt's nurse navigator at [facility] # [phone].  She states cpt code for CT will be covered under pt's authorization.  I scheduled CT for 8/16 at [facility] and have left pt a vm to call me back for appt info.

## 2021-02-28 ENCOUNTER — Other Ambulatory Visit: Payer: Self-pay

## 2021-02-28 ENCOUNTER — Ambulatory Visit (INDEPENDENT_AMBULATORY_CARE_PROVIDER_SITE_OTHER)
Admission: RE | Admit: 2021-02-28 | Discharge: 2021-02-28 | Disposition: A | Discharge status: Home / Self Care | Payer: No Typology Code available for payment source | Source: Ambulatory Visit | Attending: Emergency Medicine | Admitting: Emergency Medicine

## 2021-02-28 DIAGNOSIS — R911 Solitary pulmonary nodule: Secondary | ICD-10-CM | POA: Diagnosis not present

## 2021-03-03 ENCOUNTER — Other Ambulatory Visit: Payer: Self-pay

## 2021-03-03 ENCOUNTER — Encounter (HOSPITAL_COMMUNITY): Payer: Self-pay | Admitting: Emergency Medicine

## 2021-03-03 ENCOUNTER — Other Ambulatory Visit: Payer: Self-pay | Admitting: Emergency Medicine

## 2021-03-03 LAB — SARS CORONAVIRUS 2 (TAT 6-24 HRS): SARS Coronavirus 2: NEGATIVE

## 2021-03-03 NOTE — Progress Notes (Signed)
Anesthesia Chart Review: Family work-up  Recent admission 7/5 through 01/20/2021 at [facility] for NSTEMI from demand ischemia secondary to A. fib with RVR.  Patient was evaluated by cardiology during admission.  Underwent catheterization which showed stable CAD (patent overlapping LAD stents with moderate in-stent restenosis of 40%), echo with normal LV systolic function.  He did convert to sinus rhythm during admission.  He was started on Eliquis 5 mg twice daily with a CHA2DS2-VASc score of 4.  He was also noted to have likely lung cancer with recent nodule was hypermetabolic on PET scan.  Per discharge summary, outpatient EBUS was being arranged.  Per Dr. Agustina Caroli note 02/16/2021, patient instructed to stop Eliquis 2 days prior to procedure.  CKD 3 followed at the [place].  Per notes in care everywhere, baseline creatinine ranges about 1.4-1.8.  He did have AKI during recent admission that resolved.  History of COPD, maintained on Advair and as needed albuterol.  Patient will need day of surgery labs and evaluation.  EKG 01/19/2021: Sinus bradycardia with Premature atrial complexes.  Rate 48. Septal infarct , age undetermined. No significant change since last tracing  Cath 01/19/2021: Conclusions: Mild to moderate, non-critical coronary artery disease. Patent overlapping LAD stents with moderate in-stent restenosis (~40%). Mildly elevated left ventricular filling pressure (LVEDP 20-25 mmHg).   Recommendations: Images reviewed with Dr. Angelena Form.  Elevated troponin and chest discomfort in the setting of atrial fibrillation with rapid ventricular response are consistent with demand ischemia.  Recommend medical therapy. Restart IV heparin 2 hours after removal of TR band.  Consider transitioning to [place] as soon as tomorrow morning if there is no evidence of bleeding or vascular injury. Aggressive secondary prevention of coronary artery disease.  TTE 01/17/2021:  1. Left ventricular ejection fraction, by  estimation, is 60 to 65%. The  left ventricle has normal function. The left ventricle has no regional  wall motion abnormalities. There is mild left ventricular hypertrophy.  Left ventricular diastolic parameters  are consistent with Grade I diastolic dysfunction (impaired relaxation).   2. Right ventricular systolic function is normal. The right ventricular  size is normal. There is normal pulmonary artery systolic pressure.   3. The mitral valve is grossly normal. No evidence of mitral valve  regurgitation. No evidence of mitral stenosis. Moderate mitral annular  calcification.   4. The aortic valve is tricuspid. There is mild thickening of the aortic  valve. Aortic valve regurgitation is not visualized. No aortic stenosis is  present.   5. The inferior vena cava is normal in size with greater than 50%  respiratory variability, suggesting right atrial pressure of 3 mmHg.   Comparison(s): A prior study was performed on 04/10/2019. No significant  change from prior study.     Wynonia Musty [facility] Short Stay Center/Anesthesiology Phone [phone] 03/03/2021 11:56 AM

## 2021-03-03 NOTE — Anesthesia Preprocedure Evaluation (Addendum)
Anesthesia Evaluation  Patient identified by MRN, date of birth, ID band Patient awake    Reviewed: Allergy & Precautions, H&P , NPO status , Patient's Chart, lab work & pertinent test results  Airway Mallampati: III  TM Distance: >3 FB Neck ROM: Full    Dental no notable dental hx. (+) Edentulous Upper, Edentulous Lower, Dental Advisory Given   Pulmonary COPD,  COPD inhaler, former smoker,    Pulmonary exam normal breath sounds clear to auscultation       Cardiovascular Exercise Tolerance: Good hypertension, Pt. on medications and Pt. on home beta blockers + CAD, + Past MI, + Cardiac Stents and + DOE  + dysrhythmias Atrial Fibrillation  Rhythm:Regular Rate:Normal     Neuro/Psych negative neurological ROS  negative psych ROS   GI/Hepatic Neg liver ROS, GERD  Medicated,  Endo/Other  negative endocrine ROS  Renal/GU negative Renal ROS  negative genitourinary   Musculoskeletal   Abdominal   Peds  Hematology negative hematology ROS (+)   Anesthesia Other Findings   Reproductive/Obstetrics negative OB ROS                           Anesthesia Physical Anesthesia Plan  ASA: 3  Anesthesia Plan: General   Post-op Pain Management:    Induction: Intravenous  PONV Risk Score and Plan: 3 and Ondansetron, Dexamethasone and Treatment may vary due to age or medical condition  Airway Management Planned: Oral ETT  Additional Equipment:   Intra-op Plan:   Post-operative Plan: Extubation in OR  Informed Consent: I have reviewed the patients History and Physical, chart, labs and discussed the procedure including the risks, benefits and alternatives for the proposed anesthesia with the patient or authorized representative who has indicated his/her understanding and acceptance.     Dental advisory given  Plan Discussed with: CRNA  Anesthesia Plan Comments: (PAT note by Karoline Caldwell,  PA-C: Recent admission 7/5 through 01/20/2021 at [facility] for NSTEMI from demand ischemia secondary to A. fib with RVR.  Patient was evaluated by cardiology during admission.  Underwent catheterization which showed stable CAD (patent overlapping LAD stents with moderate in-stent restenosis of 40%), echo with normal LV systolic function.  He did convert to sinus rhythm during admission.  He was started on Eliquis 5 mg twice daily with a CHA2DS2-VASc score of 4.  He was also noted to have likely lung cancer with recent nodule was hypermetabolic on PET scan.  Per discharge summary, outpatient EBUS was being arranged.  Per Dr. Agustina Caroli note 02/16/2021, patient instructed to stop Eliquis 2 days prior to procedure.  CKD 3 followed at the [place].  Per notes in care everywhere, baseline creatinine ranges about 1.4-1.8.  He did have AKI during recent admission that resolved.  History of COPD, maintained on Advair and as needed albuterol.  Patient will need day of surgery labs and evaluation.  EKG 01/19/2021: Sinus bradycardia with Premature atrial complexes.  Rate 48. Septal infarct , age undetermined. No significant change since last tracing  Cath 01/19/2021: Conclusions: 1. Mild to moderate, non-critical coronary artery disease. 2. Patent overlapping LAD stents with moderate in-stent restenosis (~40%). 3. Mildly elevated left ventricular filling pressure (LVEDP 20-25 mmHg).  Recommendations: 1. Images reviewed with Dr. Angelena Form. Elevated troponin and chest discomfort in the setting of atrial fibrillation with rapid ventricular response are consistent with demand ischemia. Recommend medical therapy. 2. Restart IV heparin 2 hours after removal of TR band. Consider transitioning to [place] as  soon as tomorrow morning if there is no evidence of bleeding or vascular injury. 3. Aggressive secondary prevention of coronary artery disease.  TTE 01/17/2021: 1. Left ventricular ejection fraction, by estimation, is 60 to 65%.  The  left ventricle has normal function. The left ventricle has no regional  wall motion abnormalities. There is mild left ventricular hypertrophy.  Left ventricular diastolic parameters  are consistent with Grade I diastolic dysfunction (impaired relaxation).  2. Right ventricular systolic function is normal. The right ventricular  size is normal. There is normal pulmonary artery systolic pressure.  3. The mitral valve is grossly normal. No evidence of mitral valve  regurgitation. No evidence of mitral stenosis. Moderate mitral annular  calcification.  4. The aortic valve is tricuspid. There is mild thickening of the aortic  valve. Aortic valve regurgitation is not visualized. No aortic stenosis is  present.  5. The inferior vena cava is normal in size with greater than 50%  respiratory variability, suggesting right atrial pressure of 3 mmHg.   Comparison(s): A prior study was performed on 04/10/2019. No significant  change from prior study.   )      Anesthesia Quick Evaluation

## 2021-03-03 NOTE — Progress Notes (Signed)
PCP - Dr. Collins [place] Cardiologist - Dr. Chapman Fitch EKG - 01/19/21 Chest x-ray - 01/17/21 ECHO - 01/17/21 Cardiac Cath - 01/19/21 CPAP - pt claims to have OSA but does not wear CPAP or home O2 d/t not being able to stand wearing mask to bed  Blood Thinner Instructions: per pt, per MD to stop 2 days prior to surgery - last dose 03/03/21 Aspirin Instructions:   ERAS Protcol - n/a  COVID TEST- 03/03/21  Anesthesia review: yes  -------------  SDW INSTRUCTIONS:  Your procedure is scheduled on Monday 8/22. Please report to [facility] Main Entrance "A" at 0530 A.M., and check in at the Admitting office. Call this number if you have problems the morning of surgery: [phone]   Remember: Do not eat or drink after midnight the night before your surgery   Medications to take morning of surgery with a sip of water include: metoprolol succinate (TOPROL-XL) pantoprazole (PROTONIX)   If needed: albuterol (PROVENTIL) --- Please bring all inhalers with you the day of surgery.  fluticasone (FLONASE)   As of today, STOP taking any Aspirin (unless otherwise instructed by your surgeon), Aleve, Naproxen, Ibuprofen, Motrin, Advil, Goody's, BC's, all herbal medications, fish oil, and all vitamins.    The Morning of Surgery Do not wear jewelry Do not wear lotions, powders, colognes, or deodorant Men may shave face and neck. Do not bring valuables to the hospital. [facility] is not responsible for any belongings or valuables.  If you are a smoker, DO NOT Smoke 24 hours prior to surgery  If you wear a CPAP at night please bring your mask the morning of surgery   Remember that you must have someone to transport you home after your surgery, and remain with you for 24 hours if you are discharged the same day.  Please bring cases for contacts, glasses, hearing aids, dentures or bridgework because it cannot be worn into surgery.   Patients discharged the day of surgery will not be allowed to drive home.    Please shower the NIGHT BEFORE/MORNING OF SURGERY (use antibacterial soap like DIAL soap if possible). Wear comfortable clothes the morning of surgery. Oral Hygiene is also important to reduce your risk of infection.  Remember - BRUSH YOUR TEETH THE MORNING OF SURGERY WITH YOUR REGULAR TOOTHPASTE  Patient denies shortness of breath, fever, cough and chest pain.

## 2021-03-06 ENCOUNTER — Other Ambulatory Visit: Payer: Self-pay

## 2021-03-06 ENCOUNTER — Ambulatory Visit (HOSPITAL_COMMUNITY): Payer: No Typology Code available for payment source | Admitting: Physician Assistant

## 2021-03-06 ENCOUNTER — Encounter (HOSPITAL_COMMUNITY): Payer: Self-pay | Admitting: Emergency Medicine

## 2021-03-06 ENCOUNTER — Telehealth: Payer: Self-pay | Admitting: Emergency Medicine

## 2021-03-06 ENCOUNTER — Encounter (HOSPITAL_COMMUNITY): Payer: Self-pay

## 2021-03-06 ENCOUNTER — Ambulatory Visit (HOSPITAL_COMMUNITY): Payer: No Typology Code available for payment source

## 2021-03-06 ENCOUNTER — Encounter (HOSPITAL_COMMUNITY)
Admission: RE | Disposition: A | Discharge status: Home / Self Care | Payer: Self-pay | Source: Home / Self Care | Attending: Emergency Medicine

## 2021-03-06 ENCOUNTER — Ambulatory Visit (HOSPITAL_COMMUNITY)
Admission: RE | Admit: 2021-03-06 | Discharge: 2021-03-06 | Disposition: A | Discharge status: Home / Self Care | Payer: No Typology Code available for payment source | Attending: Emergency Medicine | Admitting: Emergency Medicine

## 2021-03-06 ENCOUNTER — Emergency Department (HOSPITAL_COMMUNITY)
Admission: EM | Admit: 2021-03-06 | Discharge: 2021-03-16 | Disposition: E | Payer: No Typology Code available for payment source | Attending: Emergency Medicine | Admitting: Emergency Medicine

## 2021-03-06 DIAGNOSIS — R911 Solitary pulmonary nodule: Secondary | ICD-10-CM | POA: Diagnosis not present

## 2021-03-06 DIAGNOSIS — Z419 Encounter for procedure for purposes other than remedying health state, unspecified: Secondary | ICD-10-CM

## 2021-03-06 DIAGNOSIS — E785 Hyperlipidemia, unspecified: Secondary | ICD-10-CM | POA: Diagnosis not present

## 2021-03-06 DIAGNOSIS — Z9889 Other specified postprocedural states: Secondary | ICD-10-CM

## 2021-03-06 DIAGNOSIS — Z888 Allergy status to other drugs, medicaments and biological substances status: Secondary | ICD-10-CM | POA: Diagnosis not present

## 2021-03-06 DIAGNOSIS — I2510 Atherosclerotic heart disease of native coronary artery without angina pectoris: Secondary | ICD-10-CM | POA: Insufficient documentation

## 2021-03-06 DIAGNOSIS — I259 Chronic ischemic heart disease, unspecified: Secondary | ICD-10-CM | POA: Diagnosis not present

## 2021-03-06 DIAGNOSIS — I469 Cardiac arrest, cause unspecified: Secondary | ICD-10-CM | POA: Diagnosis not present

## 2021-03-06 DIAGNOSIS — J439 Emphysema, unspecified: Secondary | ICD-10-CM | POA: Insufficient documentation

## 2021-03-06 DIAGNOSIS — I10 Essential (primary) hypertension: Secondary | ICD-10-CM | POA: Insufficient documentation

## 2021-03-06 DIAGNOSIS — Z880 Allergy status to penicillin: Secondary | ICD-10-CM | POA: Insufficient documentation

## 2021-03-06 DIAGNOSIS — Z87891 Personal history of nicotine dependence: Secondary | ICD-10-CM | POA: Insufficient documentation

## 2021-03-06 DIAGNOSIS — Z79899 Other long term (current) drug therapy: Secondary | ICD-10-CM | POA: Insufficient documentation

## 2021-03-06 DIAGNOSIS — Z96653 Presence of artificial knee joint, bilateral: Secondary | ICD-10-CM | POA: Diagnosis not present

## 2021-03-06 DIAGNOSIS — Z7901 Long term (current) use of anticoagulants: Secondary | ICD-10-CM | POA: Diagnosis not present

## 2021-03-06 DIAGNOSIS — R9389 Abnormal findings on diagnostic imaging of other specified body structures: Secondary | ICD-10-CM | POA: Diagnosis not present

## 2021-03-06 DIAGNOSIS — Z7982 Long term (current) use of aspirin: Secondary | ICD-10-CM | POA: Diagnosis not present

## 2021-03-06 DIAGNOSIS — Z955 Presence of coronary angioplasty implant and graft: Secondary | ICD-10-CM | POA: Insufficient documentation

## 2021-03-06 DIAGNOSIS — R590 Localized enlarged lymph nodes: Secondary | ICD-10-CM | POA: Insufficient documentation

## 2021-03-06 HISTORY — PX: VIDEO BRONCHOSCOPY WITH ENDOBRONCHIAL NAVIGATION: SHX6175

## 2021-03-06 HISTORY — PX: BRONCHIAL BIOPSY: SHX5109

## 2021-03-06 HISTORY — PX: BRONCHIAL WASHINGS: SHX5105

## 2021-03-06 HISTORY — PX: FINE NEEDLE ASPIRATION: SHX5430

## 2021-03-06 HISTORY — PX: BRONCHIAL BRUSHINGS: SHX5108

## 2021-03-06 HISTORY — PX: VIDEO BRONCHOSCOPY WITH RADIAL ENDOBRONCHIAL ULTRASOUND: SHX6849

## 2021-03-06 HISTORY — PX: VIDEO BRONCHOSCOPY WITH ENDOBRONCHIAL ULTRASOUND: SHX6177

## 2021-03-06 LAB — CBG MONITORING, ED: Glucose-Capillary: 209 mg/dL — ABNORMAL HIGH (ref 70–99)

## 2021-03-06 LAB — BASIC METABOLIC PANEL
Anion gap: 5 (ref 5–15)
BUN: 19 mg/dL (ref 8–23)
CO2: 27 mmol/L (ref 22–32)
Calcium: 9.2 mg/dL (ref 8.9–10.3)
Chloride: 103 mmol/L (ref 98–111)
Creatinine, Ser: 1.24 mg/dL (ref 0.61–1.24)
GFR, Estimated: >60 mL/min (ref >60)
Glucose, Bld: 102 mg/dL — ABNORMAL HIGH (ref 70–99)
Potassium: 4 mmol/L (ref 3.5–5.1)
Sodium: 135 mmol/L (ref 135–145)

## 2021-03-06 LAB — CBC
HCT: 36.7 % — ABNORMAL LOW (ref 39.0–52.0)
Hemoglobin: 11.5 g/dL — ABNORMAL LOW (ref 13.0–17.0)
MCH: 31.2 pg (ref 26.0–34.0)
MCHC: 31.3 g/dL (ref 30.0–36.0)
MCV: 99.5 fL (ref 80.0–100.0)
Platelets: 233 10*3/uL (ref 150–400)
RBC: 3.69 MIL/uL — ABNORMAL LOW (ref 4.22–5.81)
RDW: 13.9 % (ref 11.5–15.5)
WBC: 6.8 10*3/uL (ref 4.0–10.5)
nRBC: 0 % (ref 0.0–0.2)

## 2021-03-06 SURGERY — BRONCHOSCOPY, WITH EBUS
Anesthesia: General | Laterality: Left

## 2021-03-06 MED ORDER — CHLORHEXIDINE GLUCONATE 0.12 % MT SOLN
15.0000 mL | Freq: Once | OROMUCOSAL | Status: AC
  Administered 2021-03-06: 15 mL via OROMUCOSAL
  Filled 2021-03-06 (×2): qty 15

## 2021-03-06 MED ORDER — APIXABAN 5 MG PO TABS: 5.0000 mg | ORAL_TABLET | Freq: Two times a day (BID) | ORAL | 0 refills | Status: AC

## 2021-03-06 MED ORDER — FENTANYL CITRATE (PF) 250 MCG/5ML IJ SOLN
INTRAMUSCULAR | Status: DC | PRN
  Administered 2021-03-06: 100 ug via INTRAVENOUS

## 2021-03-06 MED ORDER — CALCIUM CHLORIDE 10 % IV SOLN
INTRAVENOUS | Status: AC | PRN
  Administered 2021-03-06: 1 g via INTRAVENOUS

## 2021-03-06 MED ORDER — SODIUM BICARBONATE 8.4 % IV SOLN
INTRAVENOUS | Status: AC | PRN
  Administered 2021-03-06: 50 meq via INTRAVENOUS

## 2021-03-06 MED ORDER — ACETAMINOPHEN 500 MG PO TABS
1000.0000 mg | ORAL_TABLET | Freq: Once | ORAL | Status: AC
  Administered 2021-03-06: 1000 mg via ORAL
  Filled 2021-03-06: qty 2

## 2021-03-06 MED ORDER — EPHEDRINE SULFATE-NACL 50-0.9 MG/10ML-% IV SOSY
PREFILLED_SYRINGE | INTRAVENOUS | Status: DC | PRN
  Administered 2021-03-06: 10 mg via INTRAVENOUS
  Administered 2021-03-06: 15 mg via INTRAVENOUS

## 2021-03-06 MED ORDER — LIDOCAINE 2% (20 MG/ML) 5 ML SYRINGE
INTRAMUSCULAR | Status: DC | PRN
  Administered 2021-03-06: 100 mg via INTRAVENOUS

## 2021-03-06 MED ORDER — SUGAMMADEX SODIUM 200 MG/2ML IV SOLN
INTRAVENOUS | Status: DC | PRN
Start: 2021-03-06 — End: 2021-03-06
  Administered 2021-03-06: 200 mg via INTRAVENOUS

## 2021-03-06 MED ORDER — ROCURONIUM BROMIDE 10 MG/ML (PF) SYRINGE
PREFILLED_SYRINGE | INTRAVENOUS | Status: DC | PRN
  Administered 2021-03-06: 80 mg via INTRAVENOUS

## 2021-03-06 MED ORDER — ONDANSETRON HCL 4 MG/2ML IJ SOLN
INTRAMUSCULAR | Status: DC | PRN
  Administered 2021-03-06: 4 mg via INTRAVENOUS

## 2021-03-06 MED ORDER — SUCCINYLCHOLINE CHLORIDE 200 MG/10ML IV SOSY
PREFILLED_SYRINGE | INTRAVENOUS | Status: DC | PRN
  Administered 2021-03-06: 100 mg via INTRAVENOUS

## 2021-03-06 MED ORDER — DEXAMETHASONE SODIUM PHOSPHATE 10 MG/ML IJ SOLN
INTRAMUSCULAR | Status: DC | PRN
  Administered 2021-03-06: 10 mg via INTRAVENOUS

## 2021-03-06 MED ORDER — PHENYLEPHRINE HCL-NACL 20-0.9 MG/250ML-% IV SOLN
INTRAVENOUS | Status: DC | PRN
  Administered 2021-03-06: 25 ug/min via INTRAVENOUS

## 2021-03-06 MED ORDER — GLYCOPYRROLATE 0.2 MG/ML IJ SOLN
INTRAMUSCULAR | Status: DC | PRN
Start: 2021-03-06 — End: 2021-03-06
  Administered 2021-03-06: .2 mg via INTRAVENOUS

## 2021-03-06 MED ORDER — EPINEPHRINE 1 MG/10ML IJ SOSY
PREFILLED_SYRINGE | INTRAMUSCULAR | Status: AC | PRN
  Administered 2021-03-06 (×5): 1 mg via INTRAVENOUS

## 2021-03-06 MED ORDER — FENTANYL CITRATE (PF) 100 MCG/2ML IJ SOLN: 25.0000 ug | INTRAMUSCULAR | Status: DC | PRN

## 2021-03-06 MED ORDER — PROPOFOL 10 MG/ML IV BOLUS
INTRAVENOUS | Status: DC | PRN
  Administered 2021-03-06: 120 mg via INTRAVENOUS
  Administered 2021-03-06: 30 mg via INTRAVENOUS

## 2021-03-06 MED ORDER — LACTATED RINGERS IV SOLN: INTRAVENOUS | Status: DC

## 2021-03-07 ENCOUNTER — Encounter (HOSPITAL_COMMUNITY): Payer: Self-pay | Admitting: Emergency Medicine

## 2021-03-07 LAB — CYTOLOGY - NON PAP

## 2021-03-08 ENCOUNTER — Telehealth: Payer: Self-pay | Admitting: Emergency Medicine

## 2021-03-08 NOTE — Telephone Encounter (Signed)
I was able to speak with Tony Garcia today. Discussed her father's case, offered our condolences.

## 2021-03-08 NOTE — Telephone Encounter (Signed)
Have faxed over the OV note that was requested.  Nothing further is needed.

## 2021-03-08 NOTE — Telephone Encounter (Signed)
Called and spoke with Darrien (patient's daughter). She stated that she received a call from our office but was unsure of who the caller was. She also stated that she was in an area with spotty service so if the person left a VM, it has not come through yet.   I advised her that I would check to see if it was Dr. Lamonte Sakai who called her. I offered my condolences.   Dr. Lamonte Sakai, please advise if you have tried to call Darrien. Thanks!

## 2021-03-16 NOTE — Interval H&P Note (Signed)
History and Physical Interval Note:  04/03/21 7:26 AM  Tony Garcia  has presented today for surgery, with the diagnosis of LEFT LOWER LOBE NODULE.  The various methods of treatment have been discussed with the patient and family. After consideration of risks, benefits and other options for treatment, the patient has consented to  Procedure(s): VIDEO BRONCHOSCOPY WITH ENDOBRONCHIAL ULTRASOUND (Left) ROBOTIC ASSISTED BRONCHOSCOPY WITH ENDOBRONCHIAL NAVIGATION (Left) as a surgical intervention.  The patient's history has been reviewed, patient examined, no change in status, stable for surgery.  I have reviewed the patient's chart and labs.  Questions were answered to the patient's satisfaction.     [name]

## 2021-03-16 NOTE — Transfer of Care (Signed)
Immediate Anesthesia Transfer of Care Note  Patient: Tony Garcia  Procedure(s) Performed: VIDEO BRONCHOSCOPY WITH ENDOBRONCHIAL ULTRASOUND (Left) ROBOTIC ASSISTED BRONCHOSCOPY WITH ENDOBRONCHIAL NAVIGATION (Left) BRONCHIAL BIOPSIES BRONCHIAL BRUSHINGS BRONCHIAL WASHINGS FINE NEEDLE ASPIRATION (FNA) LINEAR RADIAL ENDOBRONCHIAL ULTRASOUND  Patient Location: PACU  Anesthesia Type:General  Level of Consciousness: drowsy  Airway & Oxygen Therapy: Patient Spontanous Breathing and Patient connected to face mask oxygen  Post-op Assessment: Report given to RN, Post -op Vital signs reviewed and stable and Patient moving all extremities  Post vital signs: Reviewed and stable  Last Vitals:  Vitals Value Taken Time  BP    Temp    Pulse    Resp    SpO2      Last Pain:  Vitals:   2021/03/24 0711  TempSrc:   PainSc: 3       Patients Stated Pain Goal: 3 (86/76/19 [id number])  Complications: No notable events documented.

## 2021-03-16 NOTE — Telephone Encounter (Signed)
Called and spoke with patient's daughter, Adriana Simas (DPR), she states her dad had a bronch today and did fine afterwards and on the way home.  He has someone with him at home, he woke up about 10 minutes prior to her calling us and she could not even understand him his speech was so garbled.  The person with him had to read what he was writing and tell her what he was saying.  His tongue was swelling.  He said he was not having difficult breathing at the time of the call, but his daughter felt that could change quickly given the amount of swelling he must have to make his speech so garbled.  I advised her to get him to the ER.  She said she was headed to his house to get him.  I advised her to call EMS if she felt he needed intervention by them prior to getting to the hospital.  She verbalized understanding.  Advised I would make Dr. Lamonte Sakai aware.  Dr. Lamonte Sakai,  This is just an FYI that this patient that was bronched to day is going to the ER d/t tongue swelling.  Thank you.

## 2021-03-16 NOTE — ED Triage Notes (Signed)
Pt arrived via [facility] from home c/o angioedema following bronchial procedure today. CPR initiated at 1730, total of 3 epis given, one shock delivered by EMS at 1753. Hx of lung cancer, pt arrives CPR in progress

## 2021-03-16 NOTE — Anesthesia Procedure Notes (Signed)
Procedure Name: Intubation Date/Time: 03-24-2021 7:39 AM Performed by: Kyung Rudd, CRNA Pre-anesthesia Checklist: Patient identified, Emergency Drugs available, Suction available and Patient being monitored Patient Re-evaluated:Patient Re-evaluated prior to induction Oxygen Delivery Method: Circle system utilized Preoxygenation: Pre-oxygenation with 100% oxygen Induction Type: IV induction and Rapid sequence Laryngoscope Size: Glidescope and 4 (Used Glidescope Go) Tube type: Oral Tube size: 8.5 mm Number of attempts: 1 Airway Equipment and Method: Video-laryngoscopy Placement Confirmation: ETT inserted through vocal cords under direct vision, positive ETCO2 and breath sounds checked- equal and bilateral Secured at: 22 cm Tube secured with: Tape Dental Injury: Teeth and Oropharynx as per pre-operative assessment  Comments: Electively used Glidescope Go due to limited neck mobility. Keep neck in neutral cervical alignment for intubation.

## 2021-03-16 NOTE — ED Notes (Signed)
Pt belongings given to family.

## 2021-03-16 NOTE — Telephone Encounter (Signed)
I spoke with Dr Vanita Panda in the ED earlier this evening about Mr Tony Garcia's acute progressive illness. Thank you for this note - appropriately directing him urgently to the ED.

## 2021-03-16 NOTE — Progress Notes (Signed)
   03-15-21 1800  Clinical Encounter Type  Visited With Patient and family together  Visit Type Initial;Death;ED;Social support;Spiritual support;Psychological support  Referral From Nurse  Consult/Referral To Chaplain  Spiritual Encounters  Spiritual Needs Prayer;Emotional;Grief support  Stress Factors  Patient Stress Factors Not reviewed  Family Stress Factors Exhausted;Financial concerns;Loss;Loss of control  C chaplain met family at bedside and outside of Trauma Room.  Provided emotional support in their expression of grief.  Provided spiritual cre int he offering of prayer and emotional support.  Escorted family to private room for time alone with deceased patient.   Stayed available with family through their time together.  Will remain available for any additional needs.    Respectfully Submitted,   Rev. Santiago Glad Huddelson-Adis Sturgill

## 2021-03-16 NOTE — ED Notes (Signed)
Chaplin called 

## 2021-03-16 NOTE — Discharge Instructions (Signed)
Flexible Bronchoscopy, Care After This sheet gives you information about how to care for yourself after your test. Your doctor may also give you more specific instructions. If you have problems or questions, contact your doctor. Follow these instructions at home: Eating and drinking Do not eat or drink anything (not even water) for 2 hours after your test, or until your numbing medicine (local anesthetic) wears off. When your numbness is gone and your cough and gag reflexes have come back, you may: Eat only soft foods. Slowly drink liquids. The day after the test, go back to your normal diet. Driving Do not drive for 24 hours if you were given a medicine to help you relax (sedative). Do not drive or use heavy machinery while taking prescription pain medicine. General instructions  Take over-the-counter and prescription medicines only as told by your doctor. Return to your normal activities as told. Ask what activities are safe for you. Do not use any products that have nicotine or tobacco in them. This includes cigarettes and e-cigarettes. If you need help quitting, ask your doctor. Keep all follow-up visits as told by your doctor. This is important. It is very important if you had a tissue sample (biopsy) taken. Get help right away if: You have shortness of breath that gets worse. You get light-headed. You feel like you are going to pass out (faint). You have chest pain. You cough up: More than a little blood. More blood than before. Summary Do not eat or drink anything (not even water) for 2 hours after your test, or until your numbing medicine wears off. Do not use cigarettes. Do not use e-cigarettes. Get help right away if you have chest pain.  Please call our office for any questions or concerns.  [phone]   This information is not intended to replace advice given to you by your health care provider. Make sure you discuss any questions you have with your health care  provider. Document Released: 04/29/2009 Document Revised: 06/14/2017 Document Reviewed: 07/20/2016 Elsevier Patient Education  2020 [employer].

## 2021-03-16 NOTE — Anesthesia Postprocedure Evaluation (Signed)
Anesthesia Post Note  Patient: Tony Garcia  Procedure(s) Performed: VIDEO BRONCHOSCOPY WITH ENDOBRONCHIAL ULTRASOUND (Left) ROBOTIC ASSISTED BRONCHOSCOPY WITH ENDOBRONCHIAL NAVIGATION (Left) BRONCHIAL BIOPSIES BRONCHIAL BRUSHINGS BRONCHIAL WASHINGS FINE NEEDLE ASPIRATION (FNA) LINEAR RADIAL ENDOBRONCHIAL ULTRASOUND     Patient location during evaluation: PACU Anesthesia Type: General Level of consciousness: awake and alert Pain management: pain level controlled Vital Signs Assessment: post-procedure vital signs reviewed and stable Respiratory status: spontaneous breathing, nonlabored ventilation and respiratory function stable Cardiovascular status: blood pressure returned to baseline and stable Postop Assessment: no apparent nausea or vomiting Anesthetic complications: no   No notable events documented.  Last Vitals:  Vitals:   Mar 07, 2021 1017 03/07/21 1030  BP: (!) 124/59 121/63  Pulse: (!) 53 (!) 50  Resp: 13 12  Temp:  36.4 C  SpO2: 95% 93%    Last Pain:  Vitals:   March 07, 2021 1017  TempSrc:   PainSc: 0-No pain                 Janal Haak,W. EDMOND

## 2021-03-16 NOTE — Code Documentation (Signed)
Family at beside. Family given emotional support. 

## 2021-03-16 NOTE — Op Note (Signed)
Video Bronchoscopy with Robotic Assisted Bronchoscopic Navigation, Endobronchial Ultrasound and Biopsies  Date of Operation: 03-24-2021   Pre-op Diagnosis: Left lower lobe pulmonary nodule, mediastinal adenopathy  Post-op Diagnosis: Same  Surgeon: Baltazar Apo  Assistants: None  Anesthesia: General endotracheal anesthesia  Operation: Flexible video fiberoptic bronchoscopy with robotic assistance and biopsies.  Estimated Blood Loss: Minimal  Complications: None  Indications and History: Tony Garcia is a 75 y.o. male with history of tobacco.  Found to have a slowly enlarging mixed density left lower lobe pulmonary nodule and some mild based on lymphadenopathy.  Recommendation was made to achieve tissue diagnosis via navigational bronchoscopy and endobronchial ultrasound with biopsies. The risks, benefits, complications, treatment options and expected outcomes were discussed with the patient.  The possibilities of pneumothorax, pneumonia, reaction to medication, pulmonary aspiration, perforation of a viscus, bleeding, failure to diagnose a condition and creating a complication requiring transfusion or operation were discussed with the patient who freely signed the consent.    Description of Procedure: The patient was seen in the Preoperative Area, was examined and was deemed appropriate to proceed.  The patient was taken to [facility] endoscopy room 3, identified as Tony Garcia and the procedure verified as Flexible Video Fiberoptic Bronchoscopy.  A Time Out was held and the above information confirmed.   Prior to the date of the procedure a high-resolution CT scan of the chest was performed. Utilizing ION software program a virtual tracheobronchial tree was generated to allow the creation of distinct navigation pathways to the patient's parenchymal abnormalities. After being taken to the operating room general anesthesia was initiated and the patient  was orally intubated. The video  fiberoptic bronchoscope was introduced via the endotracheal tube and a general inspection was performed which showed edematous airways but grossly normal right and left lung anatomy, aspiration of the bilateral mainstems was completed to remove any remaining secretions. Robotic catheter inserted into patient's endotracheal tube.   Target #1 left lower lobe pulmonary nodule: The distinct navigation pathways prepared prior to this procedure were then utilized to navigate to patient's lesion identified on CT scan. The robotic catheter was secured into place and the vision probe was withdrawn.  Lesion location was approximated using fluoroscopy and radial endobronchial ultrasound for peripheral targeting. Under fluoroscopic guidance transbronchial brushings and transbronchial forceps biopsies were performed to be sent for cytology and pathology. A bronchioalveolar lavage was performed in the left lower lobe and sent for cytology.    Samples navigation Target #1: 1. Transbronchial needle from left lower lobe pulmonary nodule 2. Transbronchial forceps biopsies from left lower lobe pulmonary nodule 3. Bronchoalveolar lavage from left lower lobe   EBUS Samples: 1. Transbronchial Wang needle biopsies from 4L node 2. Transbronchial Wang needle biopsies from 4R node 3. Transbronchial Wang needle biopsies from 7 node   Plans:  The patient will be discharged from the PACU to home when recovered from anesthesia and after chest x-ray is reviewed. We will review the cytology, pathology and microbiology results with the patient when they become available. Outpatient followup will be with Dr. Lamonte Sakai.    Baltazar Apo, MD, PhD Mar 24, 2021, 9:37 AM [facility] [phone] or if no answer before 7:00PM call [phone] For any issues after 7:00PM please call eLink [phone]

## 2021-03-16 NOTE — Code Documentation (Signed)
Patient time of death occurred at 84.

## 2021-03-16 NOTE — ED Provider Notes (Signed)
[facility] EMERGENCY DEPARTMENT Provider Note   CSN: [id number] Arrival date & time: 04-01-21  1801     History Chief Complaint  Patient presents with   Tony Garcia is a 75 y.o. male.  Patient is a 75 year old male with a past medical history of CAD, hypertension, HLD is presenting for cardiac arrest.  Patient arrived actively receiving compressions from EMS from home due to angioedema and respiratory distress with cardiac arrest.  EMS reported to the scene at 1730 and started CPR.  They gave a total of 3 epis.Marland Kitchen  1 shock was delivered by EMS at 1753.  Patient has a history of lung cancer.  Patient had a bronchoscopy with robotic assistance and biopsies today with Baltazar Apo, MD. After the procedure the patient went home.  Daughter reports that patient began to notice that he had swelling to his tongue and his speech became difficult to understand.  They called EMS. When EMS arrived patient went unresponsive.   Daughter denies any new exposures, fevers, chills, chest pain, shortness of breath, nausea, vomiting, diarrhea prior to the patient noticing he had tongue swelling.     Past Medical History:  Diagnosis Date   Acute respiratory failure (HCC)    CAD (coronary artery disease)    CAP (community acquired pneumonia)    Chronic low back pain    s/p steroid injections   DOE (dyspnea on exertion)    Dyslipidemia    Emphysema lung (HCC)    GERD (gastroesophageal reflux disease)    Gout    Hypercholesteremia    Hypertension    Myocardial ischemia     Patient Active Problem List   Diagnosis Date Noted   Abnormal CT of the chest 02/16/2021   Atrial fibrillation, rapid ([place])    NSTEMI (non-ST elevated myocardial infarction) ([place]) 01/17/2021   Bradycardia 04/09/2019   Essential hypertension 04/09/2019   Acute respiratory failure ([place]) 04/09/2019   Cough with hemoptysis 04/09/2019   Acute respiratory failure with hypoxia ([place])  04/09/2019   Myocardial ischemia    Emphysema lung (HCC)    CAD (coronary artery disease)    Dyslipidemia    Chronic low back pain    CAP (community acquired pneumonia) 04/08/2019    Past Surgical History:  Procedure Laterality Date   APPENDECTOMY     CHOLECYSTECTOMY     CORONARY ANGIOPLASTY WITH STENT PLACEMENT     x 3    LEFT HEART CATH AND CORONARY ANGIOGRAPHY N/A 01/19/2021   Procedure: LEFT HEART CATH AND CORONARY ANGIOGRAPHY;  Surgeon: Nelva Bush, MD;  Location: [facility];  Service: Cardiovascular;  Laterality: N/A;   REPLACEMENT TOTAL KNEE BILATERAL         History reviewed. No pertinent family history.  Social History   Tobacco Use   Smoking status: Former    Packs/day: 2.00    Years: 30.00    Pack years: 60.00    Types: Cigarettes   Smokeless tobacco: Never  Substance Use Topics   Alcohol use: Yes    Comment: bourbon about 3-5 shots a day (as of 03/03/21)   Drug use: No    Home Medications Prior to Admission medications   Medication Sig Start Date End Date Taking? Authorizing Provider  albuterol (PROVENTIL) (2.5 MG/3ML) 0.083% nebulizer solution Take 2.5 mg by nebulization every 6 (six) hours as needed for wheezing or shortness of breath.    [provider]  albuterol (VENTOLIN HFA) 108 (90  Base) MCG/ACT inhaler Inhale 2 puffs into the lungs every 6 (six) hours as needed for wheezing or shortness of breath.    [provider]  apixaban (ELIQUIS) 5 MG TABS tablet Take 1 tablet (5 mg total) by mouth 2 (two) times daily. Okay to resume this medication on 03/07/2021 03-08-2021   [name], MD  cetirizine (ZYRTEC) 10 MG tablet Take 10 mg by mouth at bedtime.    [provider]  Cholecalciferol (VITAMIN D-3) 125 MCG (5000 UT) TABS Take 5,000 Units by mouth in the morning.    [provider]  fluticasone (FLONASE) 50 MCG/ACT nasal spray Place 1-2 sprays into both nostrils daily as needed for allergies or rhinitis.     [provider]  fluticasone-salmeterol (ADVAIR) 250-50 MCG/ACT AEPB Take 1 puff by mouth 2 (two) times daily as needed (respiratory issues.). Rinse mouth after each use 08/05/20   [provider]  hydroxypropyl methylcellulose / hypromellose (ISOPTO TEARS / GONIOVISC) 2.5 % ophthalmic solution Place 1 drop into both eyes 3 (three) times daily as needed for dry eyes.    [provider]  KRILL OIL PO Take 1 capsule by mouth in the morning and at bedtime.    [provider]  lisinopril (ZESTRIL) 40 MG tablet Take 40 mg by mouth daily.    [provider]  lovastatin (MEVACOR) 20 MG tablet Take 60 mg by mouth at bedtime.    [provider]  metoprolol succinate (TOPROL-XL) 25 MG 24 hr tablet Take 12.5 mg by mouth in the morning.    [provider]  nitroGLYCERIN (NITROSTAT) 0.4 MG SL tablet Place 0.4 mg under the tongue every 5 (five) minutes x 3 doses as needed for chest pain.    [provider]  pantoprazole (PROTONIX) 40 MG tablet Take 1 tablet (40 mg total) by mouth 2 (two) times daily. 04/13/19   Hosie Poisson, MD  sodium chloride (OCEAN) 0.65 % SOLN nasal spray Place 2 sprays into both nostrils as needed for congestion.    [provider]    Allergies    Penicillins, Pravastatin, and Rosuvastatin  Review of Systems   Review of Systems  Unable to perform ROS: Critical illness    Physical Exam Updated Vital Signs Pulse (!) 0   Resp (!) 0   Ht 5\' 9"  (1.753 m)   Wt 95.3 kg   SpO2 (!) 77%   BMI 31.03 kg/m   Physical Exam Vitals and nursing note reviewed.  Constitutional:      General: He is in acute distress.     Appearance: He is ill-appearing.  HENT:     Head: Normocephalic and atraumatic.     Right Ear: External ear normal.     Left Ear: External ear normal.     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  Abdominal:     General: There is distension.  Musculoskeletal:        General: Swelling present.  Skin:    General: Skin is warm and dry.  Neurological:     Mental Status: He is unresponsive.    ED Results / Procedures / Treatments   Labs (all labs ordered are listed, but only abnormal results  are displayed) Labs Reviewed  CBG MONITORING, ED - Abnormal; Notable for the following components:      Result Value   Glucose-Capillary 209 (*)    All other components within normal limits    EKG None  Radiology  Procedures Procedures   Medications Ordered in ED Medications  EPINEPHrine (ADRENALIN) 1 MG/10ML injection (1 mg Intravenous Given 03/12/21 1813)  calcium chloride injection (1 g Intravenous Given 2021/03/12 1808)  sodium bicarbonate injection (50 mEq Intravenous Given March 12, 2021 1809)    ED Course  I have reviewed the triage vital signs and the nursing notes.  Pertinent labs & imaging results that were available during my care of the patient were reviewed by me and considered in my medical decision making (see chart for details).    MDM Rules/Calculators/A&P                          Patient is a 75 year old male that is presenting in cardiac arrest. Patient had CPR started in the field at 1730. Patient had tongue swelling and EMS performed a cricothyrotomy in the field.  EMS reported to the scene at 1730 and started CPR.  They gave a total of 3 epis.Marland Kitchen 1 shock was delivered by EMS at 1753. Of note patient had a swollen tongue and angioedema.  Patient arrived in the ED at 1753 arrived in the ED. Patient was receiving compression. Patient had a cric that was in place. Bag valve mask was used with bilateral breath sounds. Compressions were continued until time of death.  Patient was given 5 rounds of epi, bicarb  x1, calcium x1.  Bedside echo showed no cardiac activity.  Patient was oxygenating after placement of cric with the bag mask.  Patient's time of death was 18:15. Patient has no pulses or cardiac activity.   Patient had 7 mm pupils that were nonreactive bilaterally.   At this time not sure what the exact cause of death could be. Patient had a bronchoscopy today. The daughter denies any new exposures to foods, medication or detergents. It appears that the patient could have been in anaphylactic shock with angioedema causing cardiac arrest.    Medical examiner was consulted. I spoke with the family. The chaplain was consulted.   The plan for this patient was discussed with Dr. Vanita Panda, who voiced agreement and who oversaw evaluation and treatment of this patient.   Final Clinical Impression(s) / ED Diagnoses Final diagnoses:  Cardiac arrest [facility])    Rx / DC Orders ED Discharge Orders     None        Doretha Sou, MD 03/07/21 0144    Carmin Muskrat, MD 03/09/21 [phone]

## 2021-03-16 DEATH — deceased

## 2021-03-24 ENCOUNTER — Ambulatory Visit: Payer: Medicare PPO | Admitting: Emergency Medicine

## 2021-04-10 NOTE — Telephone Encounter (Signed)
error 

## 2021-12-18 IMAGING — CT CT CHEST SUPER D W/O CM
2 of 5 series · 15 of 36 positions shown, 18 images · non-contrast
Comparison: CT 04/13/2019

CLINICAL DATA: Lung nodules.

EXAM:
CT CHEST WITHOUT CONTRAST
TECHNIQUE: Multidetector CT imaging of the chest was performed using thin slice
collimation for electromagnetic bronchoscopy planning purposes,
without intravenous contrast.

[Series 4: thins · axial · 0.77mm/px · z∈[-142,+151]mm · 12 of 424 slices shown, 15 images]
[im 29/424  mediastinal]
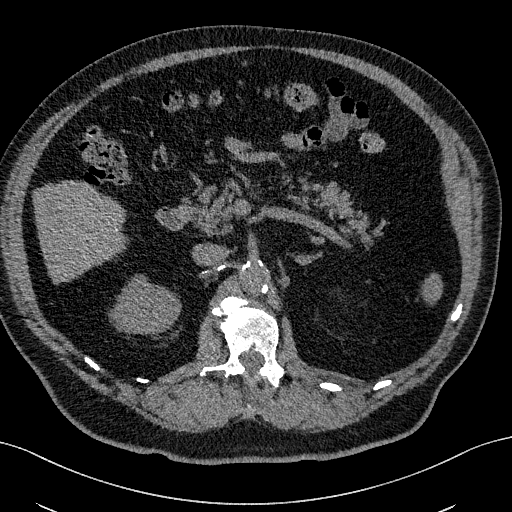
[im 29/424  lung]
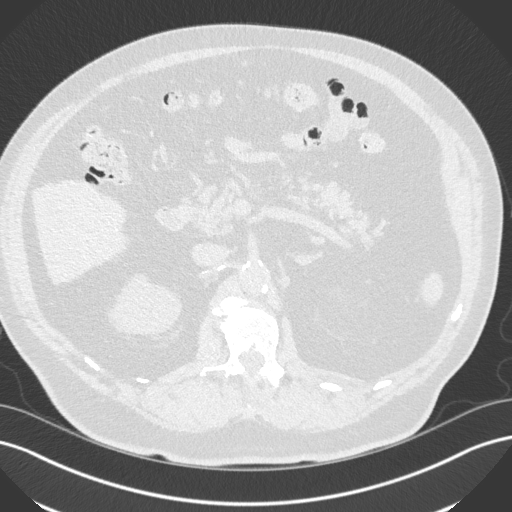
[im 57/424  lung]
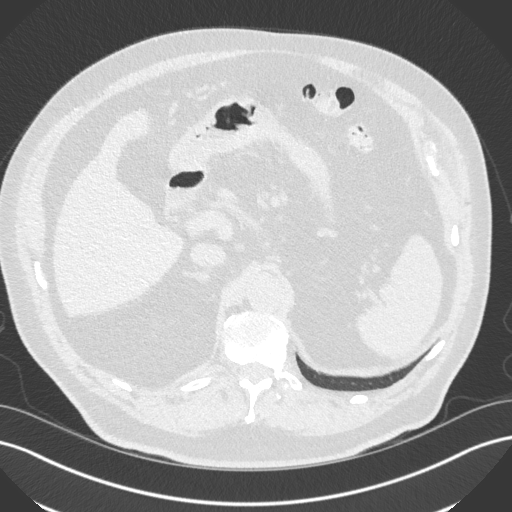
[im 85/424  lung]
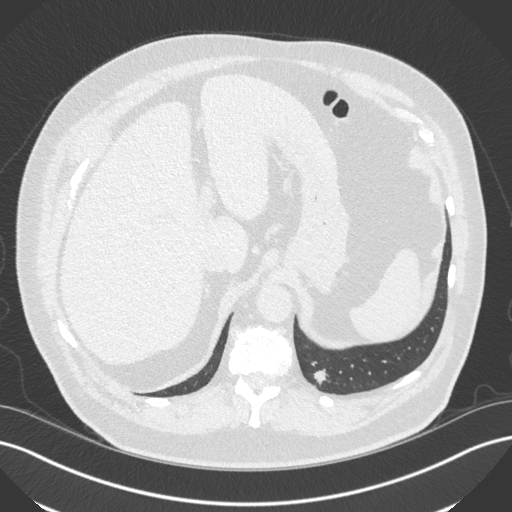
[im 142/424  lung]
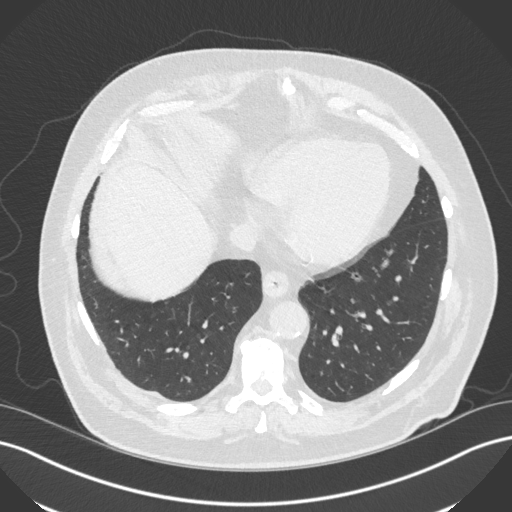
[im 170/424  mediastinal]
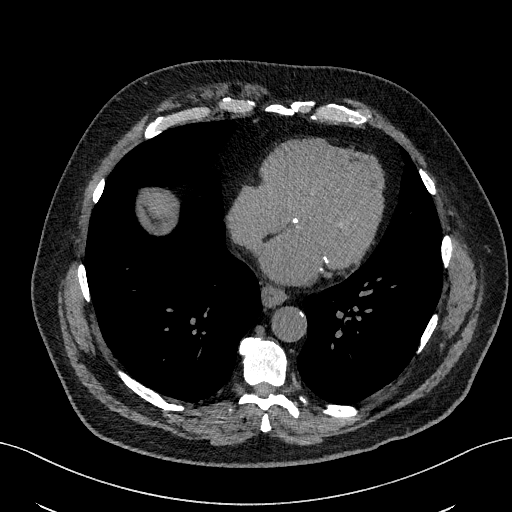
[im 170/424  lung]
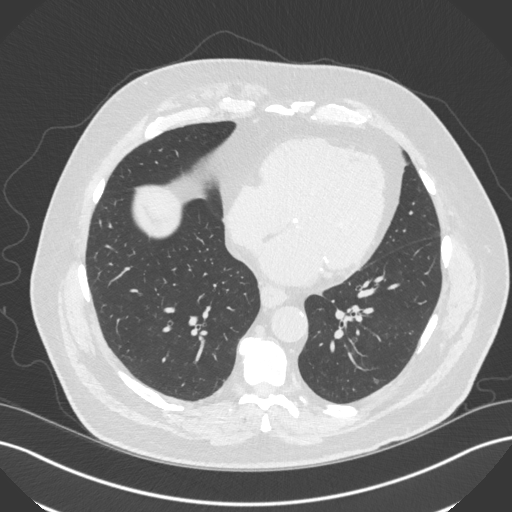
[im 198/424  lung]
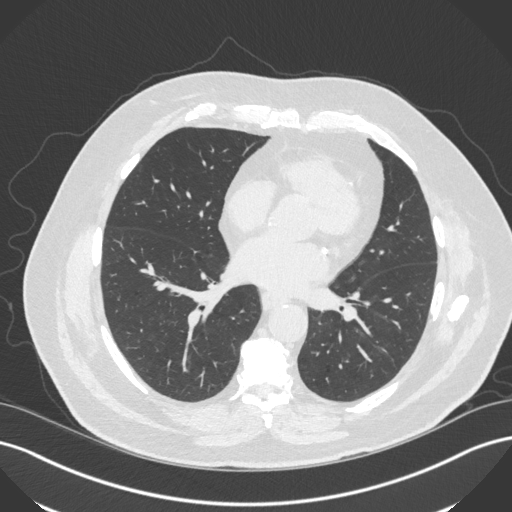
[im 226/424  lung]
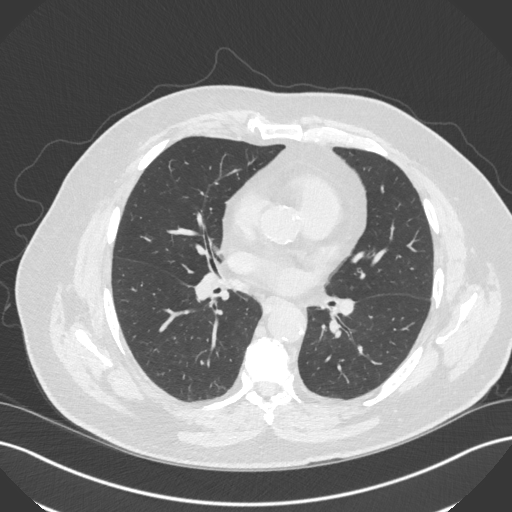
[im 254/424  lung]
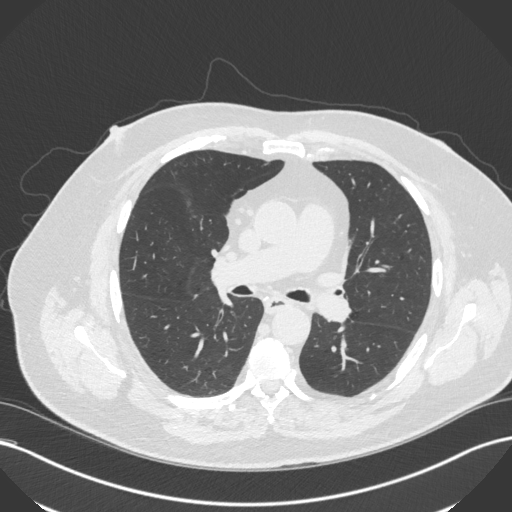
[im 283/424  mediastinal]
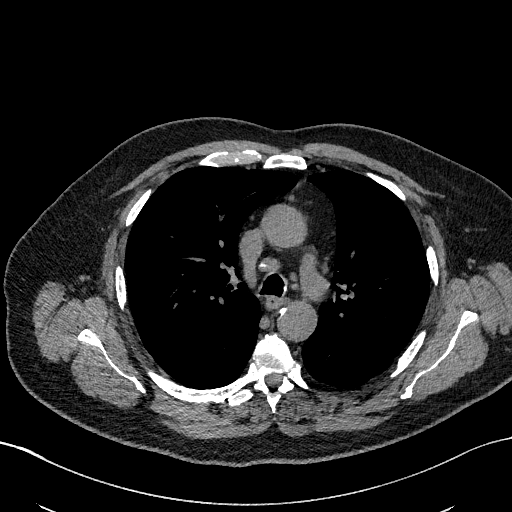
[im 283/424  lung]
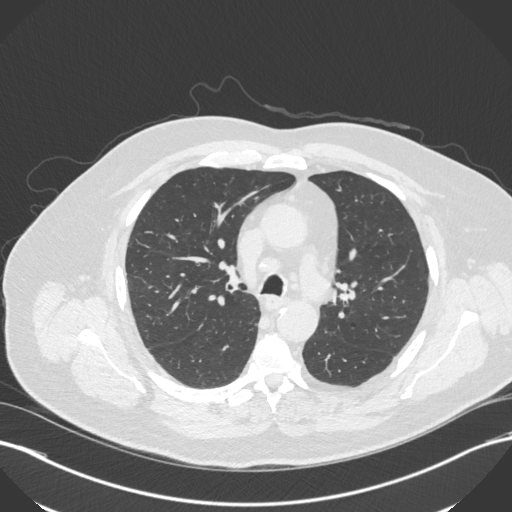
[im 339/424  lung]
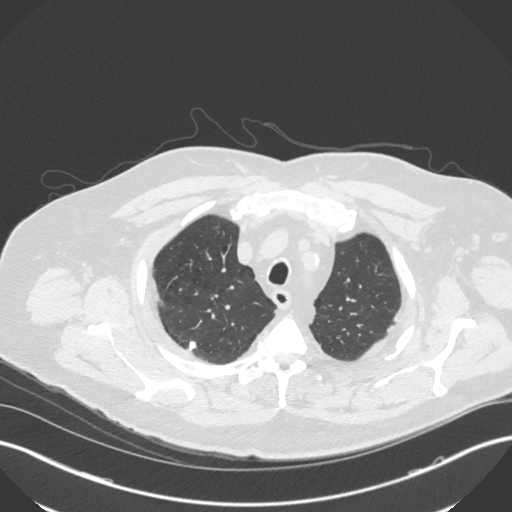
[im 367/424  lung]
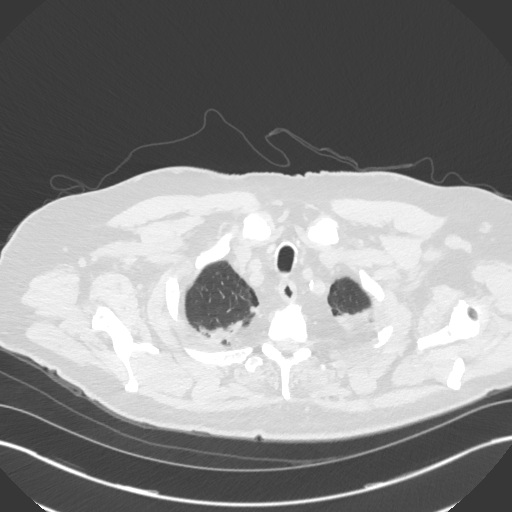
[im 395/424  lung]
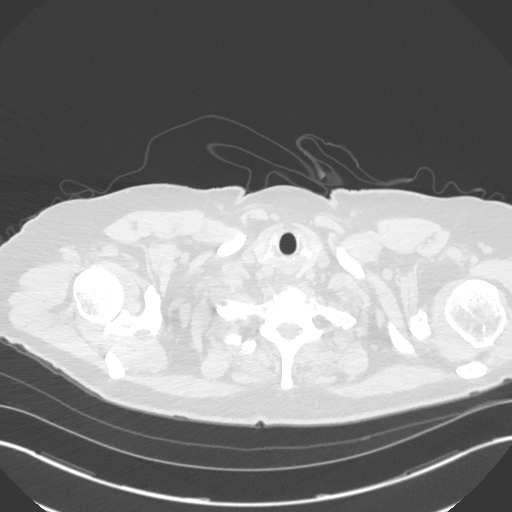

[Series 5: coronal · coronal · 0.66mm/px · 3 of 101 slices shown]
[im 21/101  lung]
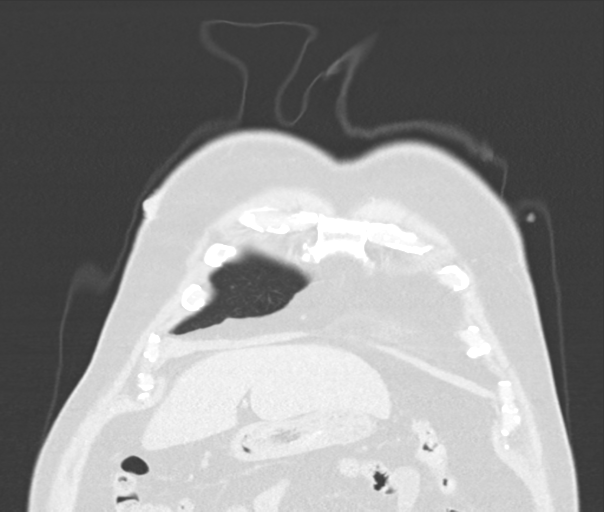
[im 41/101  lung]
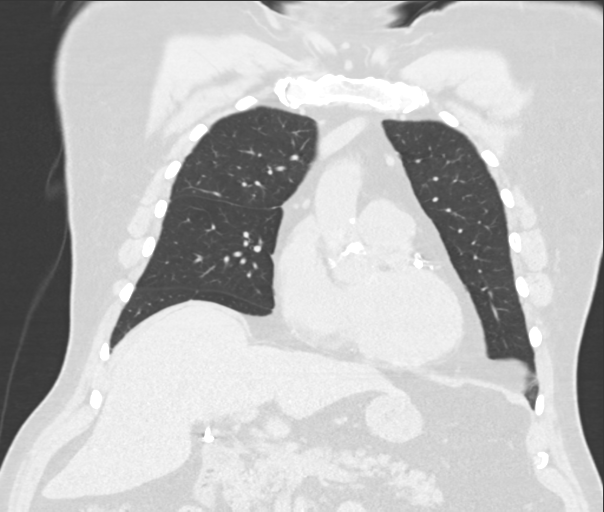
[im 61/101  lung]
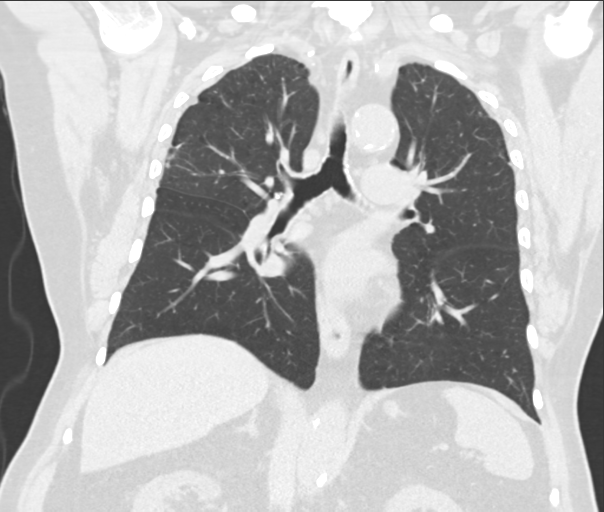

[15 of 36 positions shown; findings below may reference images not displayed]

FINDINGS: Cardiovascular: Coronary artery calcification and aortic
atherosclerotic calcification.

Mediastinum/Nodes: Scattered small mediastinal lymph nodes some with
internal calcifications. Largest node is a RIGHT lower paratracheal
node measuring 9 mm short axis. No supraclavicular adenopathy. No
axillary adenopathy.

Lungs/Pleura: Calcified pulmonary nodule in the RIGHT upper lobe
measures 8 mm.

Noncalcified nodule in the deep posterior LEFT lower lobe (image
137/3). This nodule is increased from 6 mm on comparison exam 2121
(remeasured).

Biapical pleuroparenchymal thickening appears benign.

Upper Abdomen: Limited view of the liver, kidneys, pancreas are
unremarkable. Normal adrenal glands.

Musculoskeletal: Degenerative osteophytosis of the spine.
IMPRESSION: 1. LEFT lobe pulmonary nodule. Consider one of the following in 3
months for both low-risk and high-risk individuals: (a) repeat chest
CT, (b) follow-up PET-CT, or (c) tissue sampling. This
recommendation follows the consensus statement: Guidelines for
Management of Incidental Pulmonary Nodules Detected on CT Images:
2. Calcified nodule in the RIGHT upper lobe and partially calcified
mediastinal lymph nodes suggest prior granulomatous disease.
3. Coronary artery calcification and Aortic Atherosclerosis
([id number]).

## 2021-12-24 IMAGING — DX DG CHEST 1V PORT
1 series · 1 of 1 positions shown · non-contrast
Comparison: CT chest 02/28/2021

CLINICAL DATA: Status post bronchoscopy with biopsy

EXAM:
PORTABLE CHEST 1 VIEW

[chest ap]
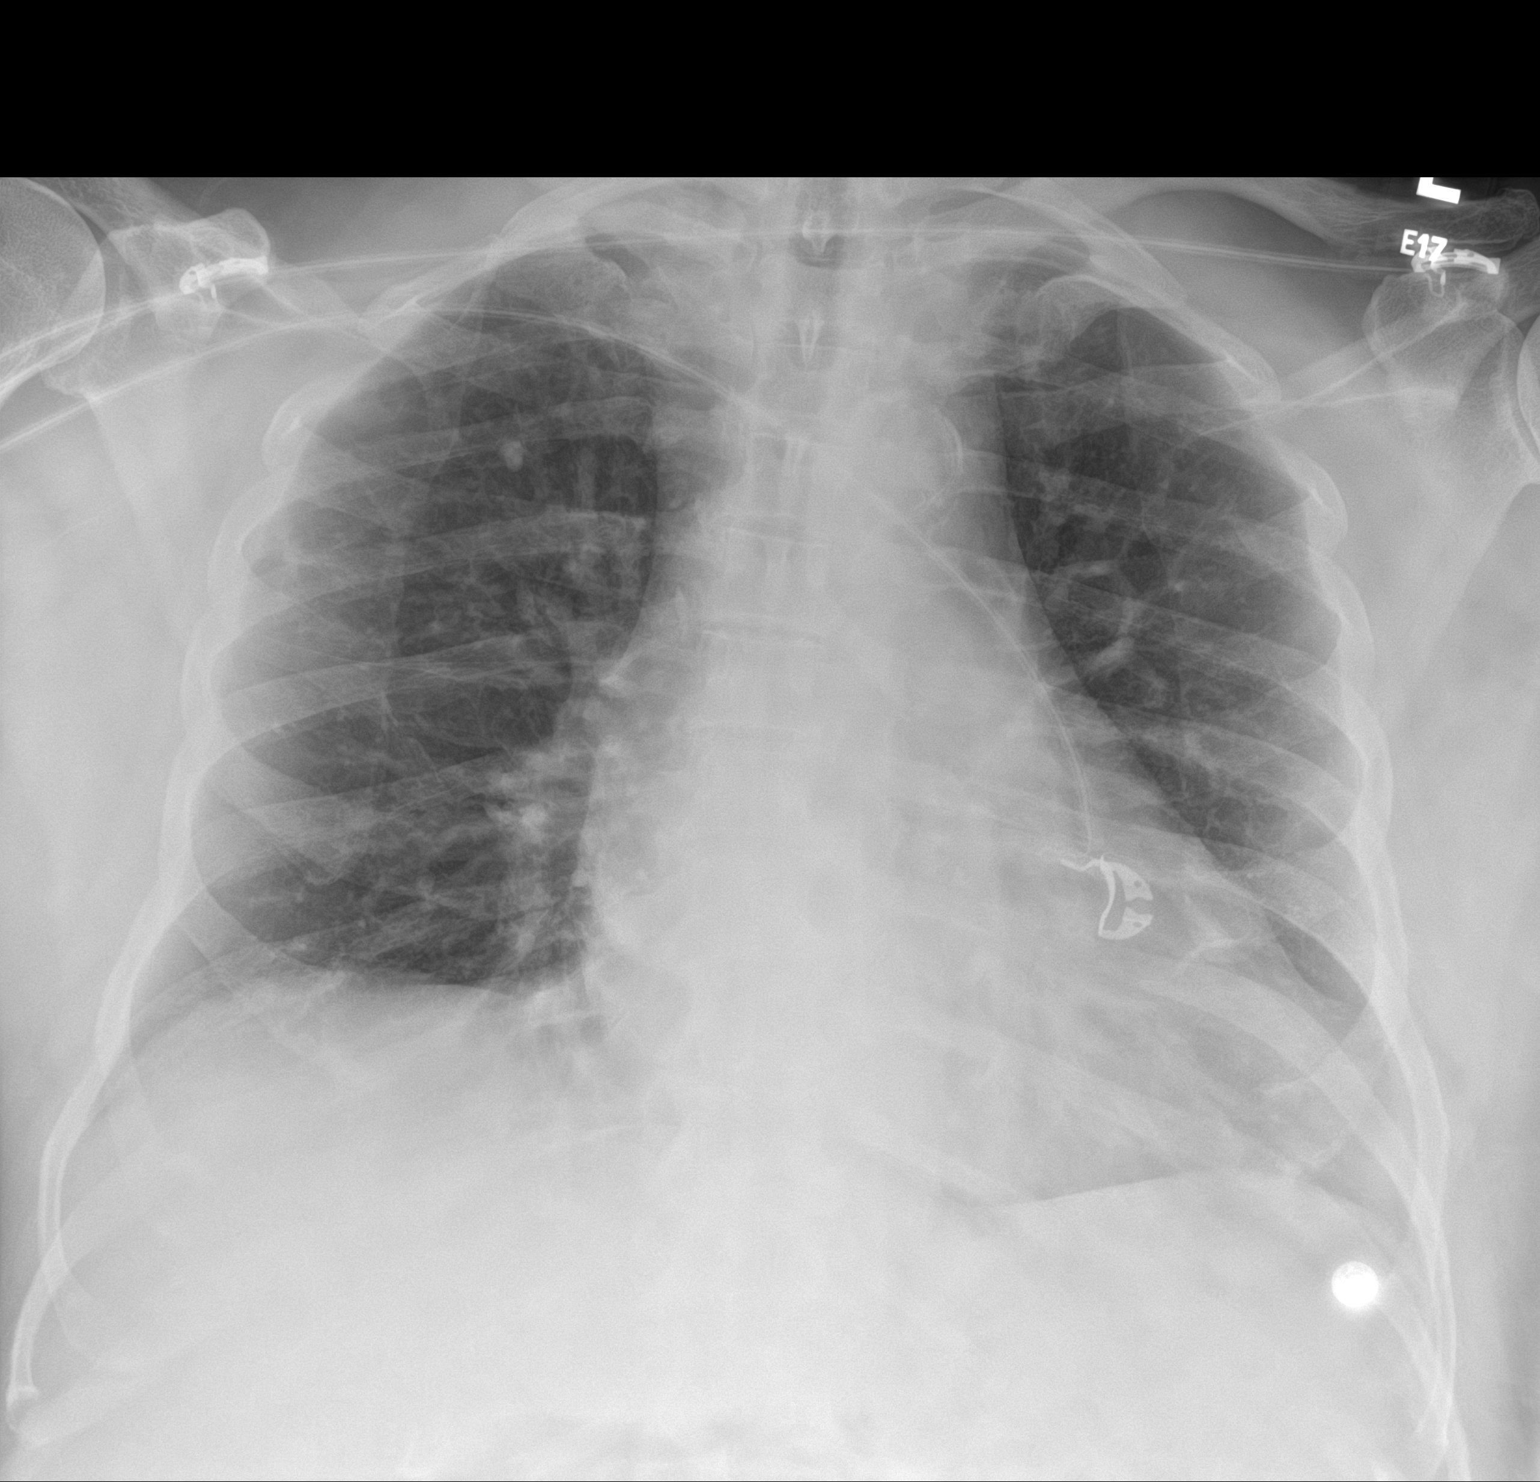

[1 of 1 positions shown; findings below may reference images not displayed]

FINDINGS: Calcified right upper lobe pulmonary nodule likely reflecting
sequela prior granulomatous disease. 9 mm left lower lobe pulmonary
nodule seen on prior CT chest is not well visualized on the current
exam. No focal consolidation. No pleural effusion or pneumothorax.
Heart and mediastinal contours are unremarkable.

No acute osseous abnormality.
IMPRESSION: 1. No pneumothorax status post left lung biopsy.
2. A 9 mm left lower lobe pulmonary nodule seen on prior CT chest is
not well visualized on the current exam.

## 2023-08-01 ENCOUNTER — Other Ambulatory Visit: Payer: Self-pay
